# Patient Record
Sex: Male | Born: 1945 | Race: White | Hispanic: No | Marital: Married | Smoking: Current every day smoker
Health system: Southern US, Community
[De-identification: ages and names within clinical notes are randomized; demographics above are authoritative.]

## PROBLEM LIST (undated history)

## (undated) DIAGNOSIS — F1010 Alcohol abuse, uncomplicated: Secondary | ICD-10-CM

## (undated) DIAGNOSIS — K922 Gastrointestinal hemorrhage, unspecified: Secondary | ICD-10-CM

## (undated) DIAGNOSIS — Z720 Tobacco use: Secondary | ICD-10-CM

## (undated) DIAGNOSIS — F419 Anxiety disorder, unspecified: Secondary | ICD-10-CM

## (undated) DIAGNOSIS — N400 Enlarged prostate without lower urinary tract symptoms: Secondary | ICD-10-CM

## (undated) DIAGNOSIS — I219 Acute myocardial infarction, unspecified: Secondary | ICD-10-CM

## (undated) DIAGNOSIS — J449 Chronic obstructive pulmonary disease, unspecified: Secondary | ICD-10-CM

## (undated) DIAGNOSIS — I2510 Atherosclerotic heart disease of native coronary artery without angina pectoris: Secondary | ICD-10-CM

## (undated) HISTORY — PX: HERNIA REPAIR: SHX51

## (undated) HISTORY — PX: CORONARY ARTERY BYPASS GRAFT: SHX141

## (undated) HISTORY — PX: OTHER SURGICAL HISTORY: SHX169

## (undated) HISTORY — PX: CARDIAC CATHETERIZATION: SHX172

---

## 1998-05-26 ENCOUNTER — Inpatient Hospital Stay (HOSPITAL_COMMUNITY): Admission: AD | Admit: 1998-05-26 | Discharge: 1998-05-26 | Payer: Self-pay | Admitting: Cardiovascular Disease

## 1998-05-30 ENCOUNTER — Inpatient Hospital Stay (HOSPITAL_COMMUNITY): Admission: AD | Admit: 1998-05-30 | Discharge: 1998-06-04 | Payer: Self-pay | Admitting: Cardiovascular Disease

## 2014-04-20 ENCOUNTER — Emergency Department (HOSPITAL_COMMUNITY)
Admission: EM | Admit: 2014-04-20 | Discharge: 2014-04-20 | Disposition: A | Discharge status: Home / Self Care | Payer: Self-pay

## 2015-01-28 ENCOUNTER — Emergency Department (HOSPITAL_COMMUNITY): Payer: BLUE CROSS/BLUE SHIELD

## 2015-01-28 ENCOUNTER — Inpatient Hospital Stay (HOSPITAL_COMMUNITY)
Admission: EM | Admit: 2015-01-28 | Discharge: 2015-02-01 | DRG: 378 | Disposition: A | Discharge status: Home / Self Care | Payer: BLUE CROSS/BLUE SHIELD | Attending: Internal Medicine | Admitting: Internal Medicine

## 2015-01-28 ENCOUNTER — Encounter (HOSPITAL_COMMUNITY): Payer: Self-pay

## 2015-01-28 DIAGNOSIS — Z7951 Long term (current) use of inhaled steroids: Secondary | ICD-10-CM | POA: Diagnosis not present

## 2015-01-28 DIAGNOSIS — N400 Enlarged prostate without lower urinary tract symptoms: Secondary | ICD-10-CM | POA: Diagnosis present

## 2015-01-28 DIAGNOSIS — Z8249 Family history of ischemic heart disease and other diseases of the circulatory system: Secondary | ICD-10-CM

## 2015-01-28 DIAGNOSIS — K922 Gastrointestinal hemorrhage, unspecified: Secondary | ICD-10-CM | POA: Diagnosis not present

## 2015-01-28 DIAGNOSIS — F1020 Alcohol dependence, uncomplicated: Secondary | ICD-10-CM | POA: Diagnosis present

## 2015-01-28 DIAGNOSIS — F10220 Alcohol dependence with intoxication, uncomplicated: Secondary | ICD-10-CM | POA: Diagnosis not present

## 2015-01-28 DIAGNOSIS — R079 Chest pain, unspecified: Secondary | ICD-10-CM | POA: Diagnosis not present

## 2015-01-28 DIAGNOSIS — K649 Unspecified hemorrhoids: Secondary | ICD-10-CM | POA: Diagnosis not present

## 2015-01-28 DIAGNOSIS — F17210 Nicotine dependence, cigarettes, uncomplicated: Secondary | ICD-10-CM | POA: Diagnosis present

## 2015-01-28 DIAGNOSIS — K2921 Alcoholic gastritis with bleeding: Secondary | ICD-10-CM | POA: Diagnosis present

## 2015-01-28 DIAGNOSIS — R0789 Other chest pain: Secondary | ICD-10-CM | POA: Diagnosis present

## 2015-01-28 DIAGNOSIS — Z7982 Long term (current) use of aspirin: Secondary | ICD-10-CM | POA: Diagnosis not present

## 2015-01-28 DIAGNOSIS — I252 Old myocardial infarction: Secondary | ICD-10-CM | POA: Diagnosis not present

## 2015-01-28 DIAGNOSIS — Z800 Family history of malignant neoplasm of digestive organs: Secondary | ICD-10-CM

## 2015-01-28 DIAGNOSIS — F17200 Nicotine dependence, unspecified, uncomplicated: Secondary | ICD-10-CM | POA: Diagnosis present

## 2015-01-28 DIAGNOSIS — Z951 Presence of aortocoronary bypass graft: Secondary | ICD-10-CM

## 2015-01-28 DIAGNOSIS — K449 Diaphragmatic hernia without obstruction or gangrene: Secondary | ICD-10-CM | POA: Diagnosis present

## 2015-01-28 DIAGNOSIS — D649 Anemia, unspecified: Secondary | ICD-10-CM | POA: Diagnosis not present

## 2015-01-28 DIAGNOSIS — I25118 Atherosclerotic heart disease of native coronary artery with other forms of angina pectoris: Secondary | ICD-10-CM | POA: Diagnosis not present

## 2015-01-28 DIAGNOSIS — I25708 Atherosclerosis of coronary artery bypass graft(s), unspecified, with other forms of angina pectoris: Secondary | ICD-10-CM | POA: Diagnosis not present

## 2015-01-28 DIAGNOSIS — J438 Other emphysema: Secondary | ICD-10-CM | POA: Diagnosis not present

## 2015-01-28 DIAGNOSIS — K648 Other hemorrhoids: Secondary | ICD-10-CM | POA: Diagnosis present

## 2015-01-28 DIAGNOSIS — I2510 Atherosclerotic heart disease of native coronary artery without angina pectoris: Secondary | ICD-10-CM | POA: Diagnosis present

## 2015-01-28 DIAGNOSIS — F1010 Alcohol abuse, uncomplicated: Secondary | ICD-10-CM | POA: Insufficient documentation

## 2015-01-28 DIAGNOSIS — F419 Anxiety disorder, unspecified: Secondary | ICD-10-CM | POA: Diagnosis present

## 2015-01-28 DIAGNOSIS — J449 Chronic obstructive pulmonary disease, unspecified: Secondary | ICD-10-CM | POA: Diagnosis present

## 2015-01-28 DIAGNOSIS — R7989 Other specified abnormal findings of blood chemistry: Secondary | ICD-10-CM

## 2015-01-28 DIAGNOSIS — K2210 Ulcer of esophagus without bleeding: Secondary | ICD-10-CM | POA: Diagnosis not present

## 2015-01-28 DIAGNOSIS — D509 Iron deficiency anemia, unspecified: Secondary | ICD-10-CM | POA: Diagnosis present

## 2015-01-28 DIAGNOSIS — K2990 Gastroduodenitis, unspecified, without bleeding: Secondary | ICD-10-CM | POA: Diagnosis not present

## 2015-01-28 DIAGNOSIS — Z791 Long term (current) use of non-steroidal anti-inflammatories (NSAID): Secondary | ICD-10-CM | POA: Diagnosis not present

## 2015-01-28 DIAGNOSIS — K2980 Duodenitis without bleeding: Secondary | ICD-10-CM | POA: Diagnosis present

## 2015-01-28 DIAGNOSIS — R778 Other specified abnormalities of plasma proteins: Secondary | ICD-10-CM

## 2015-01-28 DIAGNOSIS — I248 Other forms of acute ischemic heart disease: Secondary | ICD-10-CM | POA: Diagnosis present

## 2015-01-28 DIAGNOSIS — I209 Angina pectoris, unspecified: Secondary | ICD-10-CM | POA: Diagnosis not present

## 2015-01-28 DIAGNOSIS — K2970 Gastritis, unspecified, without bleeding: Secondary | ICD-10-CM | POA: Diagnosis not present

## 2015-01-28 HISTORY — DX: Acute myocardial infarction, unspecified: I21.9

## 2015-01-28 HISTORY — DX: Benign prostatic hyperplasia without lower urinary tract symptoms: N40.0

## 2015-01-28 HISTORY — DX: Chronic obstructive pulmonary disease, unspecified: J44.9

## 2015-01-28 HISTORY — DX: Anxiety disorder, unspecified: F41.9

## 2015-01-28 HISTORY — DX: Alcohol abuse, uncomplicated: F10.10

## 2015-01-28 HISTORY — DX: Tobacco use: Z72.0

## 2015-01-28 HISTORY — DX: Atherosclerotic heart disease of native coronary artery without angina pectoris: I25.10

## 2015-01-28 LAB — BASIC METABOLIC PANEL
Anion gap: 9 (ref 5–15)
BUN: 6 mg/dL (ref 6–23)
CALCIUM: 8.4 mg/dL (ref 8.4–10.5)
CHLORIDE: 103 mmol/L (ref 96–112)
CO2: 26 mmol/L (ref 19–32)
Creatinine, Ser: 0.65 mg/dL (ref 0.50–1.35)
GFR calc non Af Amer: >90 mL/min (ref >90)
GLUCOSE: 82 mg/dL (ref 70–99)
Potassium: 4.4 mmol/L (ref 3.5–5.1)
SODIUM: 138 mmol/L (ref 135–145)

## 2015-01-28 LAB — PROTIME-INR
INR: 1.08 (ref 0.00–1.49)
Prothrombin Time: 14.1 seconds (ref 11.6–15.2)

## 2015-01-28 LAB — CBC WITH DIFFERENTIAL/PLATELET
BASOS ABS: 0 10*3/uL (ref 0.0–0.1)
Basophils Relative: 1 % (ref 0–1)
Eosinophils Absolute: 0.2 10*3/uL (ref 0.0–0.7)
Eosinophils Relative: 3 % (ref 0–5)
HCT: 19.5 % — ABNORMAL LOW (ref 39.0–52.0)
Hemoglobin: 5.9 g/dL — CL (ref 13.0–17.0)
Lymphocytes Relative: 23 % (ref 12–46)
Lymphs Abs: 1 10*3/uL (ref 0.7–4.0)
MCH: 24.2 pg — ABNORMAL LOW (ref 26.0–34.0)
MCHC: 30.3 g/dL (ref 30.0–36.0)
MCV: 79.9 fL (ref 78.0–100.0)
MONO ABS: 0.5 10*3/uL (ref 0.1–1.0)
MONOS PCT: 11 % (ref 3–12)
NEUTROS ABS: 2.8 10*3/uL (ref 1.7–7.7)
NEUTROS PCT: 62 % (ref 43–77)
PLATELETS: 245 10*3/uL (ref 150–400)
RBC: 2.44 MIL/uL — ABNORMAL LOW (ref 4.22–5.81)
RDW: 18.1 % — ABNORMAL HIGH (ref 11.5–15.5)
WBC: 4.5 10*3/uL (ref 4.0–10.5)

## 2015-01-28 LAB — SALICYLATE LEVEL: Salicylate Lvl: <4 mg/dL (ref 2.8–20.0)

## 2015-01-28 LAB — TROPONIN I
TROPONIN I: 0.1 ng/mL — AB (ref <0.031)
Troponin I: 0.09 ng/mL — ABNORMAL HIGH (ref <0.031)

## 2015-01-28 LAB — PREPARE RBC (CROSSMATCH)

## 2015-01-28 LAB — ETHANOL: Alcohol, Ethyl (B): 47 mg/dL — ABNORMAL HIGH (ref 0–9)

## 2015-01-28 LAB — ABO/RH: ABO/RH(D): O POS

## 2015-01-28 LAB — ACETAMINOPHEN LEVEL

## 2015-01-28 MED ORDER — ONDANSETRON HCL 4 MG/2ML IJ SOLN: 4.0000 mg | Freq: Four times a day (QID) | INTRAMUSCULAR | Status: DC | PRN

## 2015-01-28 MED ORDER — BUDESONIDE-FORMOTEROL FUMARATE 160-4.5 MCG/ACT IN AERO
2.0000 | INHALATION_SPRAY | Freq: Two times a day (BID) | RESPIRATORY_TRACT | Status: DC
  Administered 2015-01-28 – 2015-02-01 (×8): 2 via RESPIRATORY_TRACT
  Filled 2015-01-28: qty 6

## 2015-01-28 MED ORDER — TAMSULOSIN HCL 0.4 MG PO CAPS
0.4000 mg | ORAL_CAPSULE | Freq: Every day | ORAL | Status: DC
  Administered 2015-01-29 – 2015-02-01 (×4): 0.4 mg via ORAL
  Filled 2015-01-28 (×4): qty 1

## 2015-01-28 MED ORDER — SODIUM CHLORIDE 0.9 % IJ SOLN
3.0000 mL | Freq: Two times a day (BID) | INTRAMUSCULAR | Status: DC
  Administered 2015-01-29 – 2015-02-01 (×6): 3 mL via INTRAVENOUS

## 2015-01-28 MED ORDER — LORAZEPAM 2 MG/ML IJ SOLN: 0.0000 mg | Freq: Two times a day (BID) | INTRAMUSCULAR | Status: DC

## 2015-01-28 MED ORDER — ONDANSETRON HCL 4 MG PO TABS
4.0000 mg | ORAL_TABLET | Freq: Four times a day (QID) | ORAL | Status: DC | PRN
  Administered 2015-01-29: 4 mg via ORAL
  Filled 2015-01-28: qty 1

## 2015-01-28 MED ORDER — LORAZEPAM 2 MG/ML IJ SOLN: 1.0000 mg | Freq: Four times a day (QID) | INTRAMUSCULAR | Status: AC | PRN

## 2015-01-28 MED ORDER — LORAZEPAM 2 MG/ML IJ SOLN: 0.0000 mg | Freq: Four times a day (QID) | INTRAMUSCULAR | Status: AC

## 2015-01-28 MED ORDER — FOLIC ACID 1 MG PO TABS
1.0000 mg | ORAL_TABLET | Freq: Every day | ORAL | Status: DC
  Administered 2015-01-28 – 2015-02-01 (×5): 1 mg via ORAL
  Filled 2015-01-28 (×5): qty 1

## 2015-01-28 MED ORDER — ADULT MULTIVITAMIN W/MINERALS CH
1.0000 | ORAL_TABLET | Freq: Every day | ORAL | Status: DC
  Administered 2015-01-28 – 2015-02-01 (×5): 1 via ORAL
  Filled 2015-01-28 (×5): qty 1

## 2015-01-28 MED ORDER — SODIUM CHLORIDE 0.9 % IV SOLN
10.0000 mL/h | Freq: Once | INTRAVENOUS | Status: AC
  Administered 2015-01-28: 10 mL/h via INTRAVENOUS

## 2015-01-28 MED ORDER — DEXTROSE-NACL 5-0.9 % IV SOLN
INTRAVENOUS | Status: DC
  Administered 2015-01-28: 23:00:00 via INTRAVENOUS

## 2015-01-28 MED ORDER — SODIUM CHLORIDE 0.9 % IV SOLN
8.0000 mg/h | INTRAVENOUS | Status: AC
  Administered 2015-01-28 – 2015-01-29 (×2): 8 mg/h via INTRAVENOUS
  Filled 2015-01-28 (×5): qty 80

## 2015-01-28 MED ORDER — TIOTROPIUM BROMIDE MONOHYDRATE 18 MCG IN CAPS
18.0000 ug | ORAL_CAPSULE | Freq: Every day | RESPIRATORY_TRACT | Status: DC
  Administered 2015-01-29 – 2015-02-01 (×4): 18 ug via RESPIRATORY_TRACT
  Filled 2015-01-28: qty 5

## 2015-01-28 MED ORDER — THIAMINE HCL 100 MG/ML IJ SOLN: 100.0000 mg | Freq: Every day | INTRAMUSCULAR | Status: DC

## 2015-01-28 MED ORDER — VITAMIN B-1 100 MG PO TABS
100.0000 mg | ORAL_TABLET | Freq: Every day | ORAL | Status: DC
Start: 2015-01-28 — End: 2015-02-01
  Administered 2015-01-28 – 2015-02-01 (×5): 100 mg via ORAL
  Filled 2015-01-28 (×5): qty 1

## 2015-01-28 MED ORDER — SODIUM CHLORIDE 0.9 % IV SOLN: INTRAVENOUS | Status: DC

## 2015-01-28 MED ORDER — PANTOPRAZOLE SODIUM 40 MG IV SOLR
INTRAVENOUS | Status: AC
  Filled 2015-01-28: qty 160

## 2015-01-28 MED ORDER — PANTOPRAZOLE SODIUM 40 MG IV SOLR
80.0000 mg | Freq: Once | INTRAVENOUS | Status: AC
  Administered 2015-01-28: 80 mg via INTRAVENOUS
  Filled 2015-01-28: qty 80

## 2015-01-28 MED ORDER — LORAZEPAM 0.5 MG PO TABS
1.0000 mg | ORAL_TABLET | Freq: Four times a day (QID) | ORAL | Status: AC | PRN
  Administered 2015-01-29 – 2015-01-31 (×2): 1 mg via ORAL
  Filled 2015-01-28 (×3): qty 2

## 2015-01-28 MED ORDER — BUDESONIDE-FORMOTEROL FUMARATE 160-4.5 MCG/ACT IN AERO
INHALATION_SPRAY | RESPIRATORY_TRACT | Status: AC
  Filled 2015-01-28: qty 6

## 2015-01-28 NOTE — Progress Notes (Signed)
eLink Physician-Brief Progress Note Patient Name: Ethan Butler DOB: 01/16/1946 MRN: [id number]   Date of Service  01/28/2015  HPI/Events of Note  69 yo male with PMHx of CAD, EtOH abuse, now with low Hb, no active bleeding per patient, noted to have (+) guiac in ER  eICU Interventions  Cont with H\H monitoring Cont with 2u PRBC IV Protonix gtt Volume CIWA      Intervention Category Evaluation Type: New Patient Evaluation  Ethan Butler 01/28/2015, 10:27 PM

## 2015-01-28 NOTE — ED Notes (Signed)
CRITICAL VALUE ALERT  Critical value received:  Hemoglobin 5.9  Date of notification:  01/28/15  Time of notification:  1800  Critical value read back:Yes.    Nurse who received alert:  Dorris Fetchaphyne Anderson, RN  MD notified (1st page):  Dr Clarene DukeMcManus  Time of first page:  [phone]

## 2015-01-28 NOTE — Progress Notes (Signed)
Spoke with Dr. Conley RollsLe to clarify confusion from ED nurse and blood transfusion order. He modified blood transfusion order to mean to tranfuse 2 units of PRBC total then. He received one in ED and is receiving one right now. Will recheck H/H after transfusion is complete and keep other unit on hand if he has not reached hgb of 10.

## 2015-01-28 NOTE — H&P (Signed)
[facility] History and Physical  Ethan Butler ZOX:[id number] DOB: 1945/11/02    PCP:   None.   Chief Complaint: chest discomfort with exertion.  HPI: Ethan Butler is an 69 y.o. male with hx of significant and ongoing alcohol abuse, hx of known CAD, s/p CABGx5 many years ago, hx of COPD and ongoing tobacco abuse, BPH, anxiety, presents to the ER complaining of increasing chest discomfort with exertion, relieved at rest.  He denied fever, chills, or coughs.  Work up in the ER included his EKG in NSR with no acute ischemic changes, and negative troponins, but his Hb was found to be 5.9 grams per dL, MCV of 79, normal WBC and platelets. Though his OB stool in the ER was positive, he denied black or bloody stool.  He had no lightheadedness, and has been rather asymptomatic at rest.  He had been taking more ASA due to his chest discomfort with exertion.  He wanted to go home, as his mother had just passed, and he has funeral to attend.  I was agreeable to stay finally, however.  I noted his BUN and his hemodynamics were in acceptable ranges.   Rewiew of Systems:  Constitutional: Negative for malaise, fever and chills. No significant weight loss or weight gain Eyes: Negative for eye pain, redness and discharge, diplopia, visual changes, or flashes of light. ENMT: Negative for ear pain, hoarseness, nasal congestion, sinus pressure and sore throat. No headaches; tinnitus, drooling, or problem swallowing. Cardiovascular: Negative for palpitations, diaphoresis, dyspnea and peripheral edema. ; No orthopnea, PND Respiratory: Negative for cough, hemoptysis, wheezing and stridor. No pleuritic chestpain. Gastrointestinal: Negative for nausea, vomiting, diarrhea, constipation, abdominal pain, melena, blood in stool, hematemesis, jaundice and rectal bleeding.    Genitourinary: Negative for frequency, dysuria, incontinence,flank pain and hematuria; Musculoskeletal: Negative for back pain and neck  pain. Negative for swelling and trauma.;  Skin: . Negative for pruritus, rash, abrasions, bruising and skin lesion.; ulcerations Neuro: Negative for headache, lightheadedness and neck stiffness. Negative for weakness, altered level of consciousness , altered mental status, extremity weakness, burning feet, involuntary movement, seizure and syncope.  Psych: negative for anxiety, depression, insomnia, tearfulness, panic attacks, hallucinations, paranoia, suicidal or homicidal ideation    Past Medical History  Diagnosis Date  . Myocardial infarction   . Enlarged prostate   . Alcohol abuse   . COPD (chronic obstructive pulmonary disease)   . Tobacco use   . Coronary artery disease   . Anxiety   . BPH (benign prostatic hypertrophy)     Past Surgical History  Procedure Laterality Date  . Open heart surgery    . Hernia repair      Medications:  HOME MEDS: Prior to Admission medications   Medication Sig Start Date End Date Taking? Authorizing Provider  aspirin 81 MG EC tablet Take 81 mg by mouth daily.   Yes Historical Provider, MD  nitroGLYCERIN (NITROSTAT) 0.4 MG SL tablet Place 0.4 mg under the tongue.   Yes Historical Provider, MD  SPIRIVA HANDIHALER 18 MCG inhalation capsule Place 1 spray into inhaler and inhale daily. 01/17/15  Yes Historical Provider, MD  SYMBICORT 160-4.5 MCG/ACT inhaler Inhale 2 puffs into the lungs 2 (two) times daily. 01/08/15  Yes Historical Provider, MD  tamsulosin (FLOMAX) 0.4 MG CAPS capsule Take 0.4 mg by mouth daily. 01/23/15  Yes Historical Provider, MD     Allergies:  No Known Allergies  Social History:   reports that he has been smoking.  He does not have  any smokeless tobacco history on file. He reports that he drinks alcohol. He reports that he does not use illicit drugs.  Family History: History reviewed. No pertinent family history.   Physical Exam: Filed Vitals:   01/28/15 1729 01/28/15 1730 01/28/15 1800 01/28/15 1830  BP: 138/67  136/68 145/65 137/59  Pulse:  109  96  Temp:      TempSrc:      Resp:  Height:      Weight:      SpO2:  95%  93%   Blood pressure 137/59, pulse 96, temperature 98.6 F (37 C), temperature source Oral, resp. rate 18, height  (1.676 m), weight 63.504 kg (140 lb), SpO2 93 %.  GEN:  Pleasant  patient lying in the stretcher in no acute distress; cooperative with exam. PSYCH:  alert and oriented x4; does not appear anxious or depressed; affect is appropriate. HEENT: Mucous membranes pink and anicteric; PERRLA; EOM intact; no cervical lymphadenopathy nor thyromegaly or carotid bruit; no JVD; There were no stridor. Neck is very supple. Breasts:: Not examined CHEST WALL: No tenderness CHEST: Normal respiration, clear to auscultation bilaterally.  HEART: Regular rate and rhythm.  There are no murmur, rub, or gallops.   BACK: No kyphosis or scoliosis; no CVA tenderness ABDOMEN: soft and non-tender; no masses, no organomegaly, normal abdominal bowel sounds; no pannus; no intertriginous candida. There is no rebound and no distention.  No ascites.  Rectal Exam: Not done EXTREMITIES: No bone or joint deformity; age-appropriate arthropathy of the hands and knees; no edema; no ulcerations.  There is no calf tenderness. Genitalia: not examined PULSES: 2+ and symmetric SKIN: Normal hydration no rash or ulceration CNS: Cranial nerves 2-12 grossly intact no focal lateralizing neurologic deficit.  Speech is fluent; uvula elevated with phonation, facial symmetry and tongue midline. DTR are normal bilaterally, cerebella exam is intact, barbinski is negative and strengths are equaled bilaterally.  No sensory loss.   Labs on Admission:  Basic Metabolic Panel:  Recent Labs Lab 01/28/15 1736  NA 138  K 4.4  CL 103  CO2 26  GLUCOSE 82  BUN 6  CREATININE 0.65  CALCIUM 8.4    Recent Labs Lab 01/28/15 1736  WBC 4.5  NEUTROABS 2.8  HGB 5.9*  HCT 19.5*  MCV 79.9  PLT 245   Cardiac  Enzymes:  Recent Labs Lab 01/28/15 1736  TROPONINI 0.09*    CBG: No results for input(s): GLUCAP in the last 168 hours.   Radiological Exams on Admission: Dg Chest Portable 1 View  01/28/2015   CLINICAL DATA:  Acute onset chest pain.  Coronary artery disease.  EXAM: PORTABLE CHEST - 1 VIEW  COMPARISON:  01/29/2009  FINDINGS: The heart size and mediastinal contours are within normal limits allowing for portable technique. Both lungs are clear. No evidence of pneumothorax or pleural effusion. Prior CABG again noted. Small hiatal hernia also noted.  IMPRESSION: No active disease.  Small hiatal hernia.   Electronically Signed   By: Myles Rosenthal M.D.   On: 01/28/2015 18:29    EKG: Independently reviewed. No ischemic changes.    Assessment/Plan Present on Admission:  . Angina pectoris . Alcohol abuse . Upper GI bleed . Tobacco abuse . Anxiety . CAD (coronary artery disease)  PLAN:  This patient presents with symptoms consistent with classical angina, known CAD, in the setting of severe anemia.  I suspect his anemia is not acute, as he has no hemodynamic instability and is  tolerating his anemia well.  He likely has a slow upper GI bleed.  There is no stigmata of chronic liver disease, and therefore, I don't think he has esosphageal varices.  Will be prudent and admit him to the ICU.  WIll transfuse him to a Hb close to 10 grams. WIll obtain anemia panel.  He will be made NPO, and please consult GI in the am.  Will d/c his ASA, and start PPI drip.  He is also at risk for alcohol withdrawal, as he drinks about 12 beers per day.  Will give scheduled Ativan and place on CIWA protocol.  I think he should have a follow up stress test after he is more stable, and had received blood.  Risk and benefit of transfusion discussed, and he gave me consent.  He is stable, full code, and [facility]  Thank you for letting me participate in his care.   Other plans as per  orders.  Code Status: FULL Unk LightningODE.    Udell Mazzocco, MD. [facility] Pager [phone] 7pm to 7am.  01/28/2015, 7:50 PM

## 2015-01-28 NOTE — ED Provider Notes (Signed)
CSN: [id number]     Arrival date & time 01/28/15  1718 History   First MD Initiated Contact with Patient 01/28/15 1728     Chief Complaint  Patient presents with  . Chest Pain      HPI  Pt was seen at 1730. Per EMS and pt report, c/o gradual onset and worsening of multiple intermittent episodes chest pain for the past 3 weeks. Pt describes the CP as "pressure" and "tightness," which radiates into his left arm, sometimes into both of his arms. Has been associated with SOB and diaphoresis. Symptoms occur when he exerts himself/walks. Symptoms improve over 15 to 25 minutes after resting and "taking some aspirin." Pt states he has taken "7 baby aspirin today" for his symptoms. Denies symptoms at rest. Denies cough, no palpitations, no abd pain, no N/V/D, no back pain.    Past Medical History  Diagnosis Date  . Myocardial infarction   . Enlarged prostate   . Alcohol abuse   . COPD (chronic obstructive pulmonary disease)   . Tobacco use   . Coronary artery disease   . Anxiety   . BPH (benign prostatic hypertrophy)    Past Surgical History  Procedure Laterality Date  . Open heart surgery    . Hernia repair      History  Substance Use Topics  . Smoking status: Current Every Day Smoker  . Smokeless tobacco: Not on file  . Alcohol Use: Yes     Comment: daily- last etoh was today, 2 25oz beers    Review of Systems  ROS: Statement: All systems negative except as marked or noted in the HPI; Constitutional: Negative for fever and chills. ; ; Eyes: Negative for eye pain, redness and discharge. ; ; ENMT: Negative for ear pain, hoarseness, nasal congestion, sinus pressure and sore throat. ; ; Cardiovascular: +CP, SOB, diaphoresis. Negative for palpitations, and peripheral edema. ; ; Respiratory: Negative for cough, wheezing and stridor. ; ; Gastrointestinal: Negative for nausea, vomiting, diarrhea, abdominal pain, blood in stool, hematemesis, jaundice and rectal bleeding. . ; ; Genitourinary:  Negative for dysuria, flank pain and hematuria. ; ; Musculoskeletal: Negative for back pain and neck pain. Negative for swelling and trauma.; ; Skin: Negative for pruritus, rash, abrasions, blisters, bruising and skin lesion.; ; Neuro: Negative for headache, lightheadedness and neck stiffness. Negative for weakness, altered level of consciousness , altered mental status, extremity weakness, paresthesias, involuntary movement, seizure and syncope.       Allergies  Review of patient's allergies indicates no known allergies.  Home Medications   Prior to Admission medications   Medication Sig Start Date End Date Taking? Authorizing Provider  aspirin 81 MG EC tablet Take 81 mg by mouth daily.   Yes Historical Provider, MD  nitroGLYCERIN (NITROSTAT) 0.4 MG SL tablet Place 0.4 mg under the tongue.   Yes Historical Provider, MD  SPIRIVA HANDIHALER 18 MCG inhalation capsule Place 1 spray into inhaler and inhale daily. 01/17/15  Yes Historical Provider, MD  SYMBICORT 160-4.5 MCG/ACT inhaler Inhale 2 puffs into the lungs 2 (two) times daily. 01/08/15  Yes Historical Provider, MD  tamsulosin (FLOMAX) 0.4 MG CAPS capsule Take 0.4 mg by mouth daily. 01/23/15  Yes Historical Provider, MD   BP 137/59 mmHg  Pulse 96  Temp(Src) 98.6 F (37 C) (Oral)  Resp 18  Ht  (1.676 m)  Wt 140 lb (63.504 kg)  BMI 22.61 kg/m2  SpO2 93% Physical Exam  1735: Physical examination:  Nursing notes reviewed;  Vital signs and O2 SAT reviewed;  Constitutional: Well developed, Well nourished, Well hydrated, In no acute distress; Head:  Normocephalic, atraumatic; Eyes: EOMI, PERRL, No scleral icterus; ENMT: Mouth and pharynx normal, Mucous membranes moist; Neck: Supple, Full range of motion, No lymphadenopathy; Cardiovascular: Regular rate and rhythm, No gallop; Respiratory: Breath sounds clear & equal bilaterally, No wheezes.  Speaking full sentences with ease, Normal respiratory effort/excursion; Chest: Nontender, Movement  normal; Abdomen: Soft, Nontender, Nondistended, Normal bowel sounds. Rectal exam performed w/permission of pt and ED RN chaperone present.  Anal tone normal.  Non-tender, minimal amount of soft brown stool in rectal vault, heme positive.  No fissures, no external hemorrhoids, no palp masses.; Genitourinary: No CVA tenderness; Extremities: Pulses normal, No tenderness, No edema, No calf edema or asymmetry.; Neuro: AA&Ox3, Major CN grossly intact.  Speech clear. No gross focal motor or sensory deficits in extremities.; Skin: Color pale, Warm, Dry.   ED Course  Procedures     EKG Interpretation   Date/Time:  Saturday January 28 2015 17:27:06 EDT Ventricular Rate:  98 PR Interval:  142 QRS Duration: 97 QT Interval:  374 QTC Calculation: 477 R Axis:   96 Text Interpretation:  Sinus rhythm Right axis deviation RSR' in V1 or V2,  probably normal variant Abnrm T, consider ischemia, anterolateral lds No  old tracing to compare Confirmed by [facility]  MD, Nicholos JohnsKATHLEEN [phone]([id number]) on  01/28/2015 5:48:50 PM      MDM  MDM Reviewed: previous chart, nursing note and vitals Interpretation: labs, ECG and x-ray Total time providing critical care: 30-74 minutes. This excludes time spent performing separately reportable procedures and services. Consults: admitting MD   CRITICAL CARE Performed by: Laray AngerMCMANUS,Karmina Zufall M Total critical care time: 35 Critical care time was exclusive of separately billable procedures and treating other patients. Critical care was necessary to treat or prevent imminent or life-threatening deterioration. Critical care was time spent personally by me on the following activities: development of treatment plan with patient and/or surrogate as well as nursing, discussions with consultants, evaluation of patient's response to treatment, examination of patient, obtaining history from patient or surrogate, ordering and performing treatments and interventions, ordering and review of laboratory  studies, ordering and review of radiographic studies, pulse oximetry and re-evaluation of patient's condition.   Results for orders placed or performed during the hospital encounter of 01/28/15  CBC with Differential  Result Value Ref Range   WBC 4.5 4.0 - 10.5 K/uL   RBC 2.44 (L) 4.22 - 5.81 MIL/uL   Hemoglobin 5.9 (LL) 13.0 - 17.0 g/dL   HCT 13.019.5 (L) 86.539.0 - 78.452.0 %   MCV 79.9 78.0 - 100.0 fL   MCH 24.2 (L) 26.0 - 34.0 pg   MCHC 30.3 30.0 - 36.0 g/dL   RDW 69.618.1 (H) 29.511.5 - 28.415.5 %   Platelets 245 150 - 400 K/uL   Neutrophils Relative % 62 43 - 77 %   Neutro Abs 2.8 1.7 - 7.7 K/uL   Lymphocytes Relative 23 12 - 46 %   Lymphs Abs 1.0 0.7 - 4.0 K/uL   Monocytes Relative 11 3 - 12 %   Monocytes Absolute 0.5 0.1 - 1.0 K/uL   Eosinophils Relative 3 0 - 5 %   Eosinophils Absolute 0.2 0.0 - 0.7 K/uL   Basophils Relative 1 0 - 1 %   Basophils Absolute 0.0 0.0 - 0.1 K/uL  Basic metabolic panel  Result Value Ref Range   Sodium 138 135 - 145 mmol/L   Potassium  4.4 3.5 - 5.1 mmol/L   Chloride 103 96 - 112 mmol/L   CO2 26 19 - 32 mmol/L   Glucose, Bld 82 70 - 99 mg/dL   BUN 6 6 - 23 mg/dL   Creatinine, Ser 7.82 0.50 - 1.35 mg/dL   Calcium 8.4 8.4 - 95.6 mg/dL   GFR calc non Af Amer >90 >90 mL/min   GFR calc Af Amer >90 >90 mL/min   Anion gap 9 5 - 15  Troponin I  Result Value Ref Range   Troponin I 0.09 (H) <0.031 ng/mL  Ethanol  Result Value Ref Range   Alcohol, Ethyl (B) 47 (H) 0 - 9 mg/dL  Type and screen  Result Value Ref Range   ABO/RH(D) O POS    Antibody Screen PENDING    Sample Expiration 01/31/2015   Prepare RBC  Result Value Ref Range   Order Confirmation ORDER PROCESSED BY BLOOD BANK   ABO/Rh  Result Value Ref Range   ABO/RH(D) O POS    Dg Chest Portable 1 View 01/28/2015   CLINICAL DATA:  Acute onset chest pain.  Coronary artery disease.  EXAM: PORTABLE CHEST - 1 VIEW  COMPARISON:  01/29/2009  FINDINGS: The heart size and mediastinal contours are within normal limits  allowing for portable technique. Both lungs are clear. No evidence of pneumothorax or pleural effusion. Prior CABG again noted. Small hiatal hernia also noted.  IMPRESSION: No active disease.  Small hiatal hernia.   Electronically Signed   By: Myles Rosenthal M.D.   On: 01/28/2015 18:29    1910:  Hgb 5.9, no old to compare; will start PRBC transfusion. Stool heme positive; likely from recent ASA use as well as daily etoh use. Will start IV Protonix bolus and gtt. Pt denies black or bloody stools. Denies CP while sitting on stretcher. Dx and testing d/w pt.  Questions answered.  Verb understanding, agreeable to admit. T/C to [facility]. Conley Rolls, case discussed, including:  HPI, pertinent PM/SHx, VS/PE, dx testing, ED course and treatment:  Agreeable to admit, requests to write temporary orders, obtain ICU bed to team APAdmits.     Samuel Jester, DO 01/30/15 2112

## 2015-01-28 NOTE — ED Notes (Signed)
Pt reports chest pain x 3 weeks.  PT says called ems today because every time he would walk, chest began to hurt and he became SOB.  EMS reports pt has taken a total of 7 baby aspirins today.  EMS started IV and performed ekg.  Pt presently pain free, and denies any SOB.

## 2015-01-29 ENCOUNTER — Encounter (HOSPITAL_COMMUNITY): Payer: Self-pay | Admitting: Gastroenterology

## 2015-01-29 DIAGNOSIS — R079 Chest pain, unspecified: Secondary | ICD-10-CM

## 2015-01-29 DIAGNOSIS — I248 Other forms of acute ischemic heart disease: Secondary | ICD-10-CM

## 2015-01-29 DIAGNOSIS — F1020 Alcohol dependence, uncomplicated: Secondary | ICD-10-CM

## 2015-01-29 DIAGNOSIS — D649 Anemia, unspecified: Secondary | ICD-10-CM

## 2015-01-29 DIAGNOSIS — J449 Chronic obstructive pulmonary disease, unspecified: Secondary | ICD-10-CM

## 2015-01-29 LAB — MRSA PCR SCREENING: MRSA by PCR: NEGATIVE

## 2015-01-29 LAB — HEMOGLOBIN AND HEMATOCRIT, BLOOD
HEMATOCRIT: 27.7 % — AB (ref 39.0–52.0)
HEMOGLOBIN: 8.6 g/dL — AB (ref 13.0–17.0)

## 2015-01-29 LAB — TSH: TSH: 2.553 u[IU]/mL (ref 0.350–4.500)

## 2015-01-29 LAB — RETICULOCYTES
RBC.: 3.2 MIL/uL — ABNORMAL LOW (ref 4.22–5.81)
Retic Count, Absolute: 28.8 10*3/uL (ref 19.0–186.0)
Retic Ct Pct: 0.9 % (ref 0.4–3.1)

## 2015-01-29 LAB — HEMATOCRIT: HEMATOCRIT: 26.2 % — AB (ref 39.0–52.0)

## 2015-01-29 LAB — FERRITIN: Ferritin: 9 ng/mL — ABNORMAL LOW (ref 22–322)

## 2015-01-29 LAB — TROPONIN I
TROPONIN I: 0.11 ng/mL — AB (ref <0.031)
Troponin I: 0.1 ng/mL — ABNORMAL HIGH (ref <0.031)

## 2015-01-29 LAB — IRON AND TIBC
Iron: 174 ug/dL — ABNORMAL HIGH (ref 42–165)
Saturation Ratios: 40 % (ref 20–55)
TIBC: 430 ug/dL (ref 215–435)
UIBC: 256 ug/dL (ref 125–400)

## 2015-01-29 LAB — FOLATE

## 2015-01-29 LAB — VITAMIN B12: VITAMIN B 12: 277 pg/mL (ref 211–911)

## 2015-01-29 LAB — HEMOGLOBIN: HEMOGLOBIN: 8.1 g/dL — AB (ref 13.0–17.0)

## 2015-01-29 LAB — PREPARE RBC (CROSSMATCH)

## 2015-01-29 MED ORDER — SODIUM CHLORIDE 0.9 % IV SOLN
Freq: Once | INTRAVENOUS | Status: AC
Start: 2015-01-29 — End: 2015-01-29
  Administered 2015-01-29: 10:00:00 via INTRAVENOUS

## 2015-01-29 MED ORDER — SODIUM CHLORIDE 0.9 % IV SOLN: Freq: Once | INTRAVENOUS | Status: AC

## 2015-01-29 MED ORDER — PANTOPRAZOLE SODIUM 40 MG PO TBEC
40.0000 mg | DELAYED_RELEASE_TABLET | Freq: Two times a day (BID) | ORAL | Status: DC
  Administered 2015-01-30 – 2015-02-01 (×5): 40 mg via ORAL
  Filled 2015-01-29 (×5): qty 1

## 2015-01-29 NOTE — Progress Notes (Signed)
  Echocardiogram 2D Echocardiogram has been performed.  Ethan Butler, Ethan Butler A 01/29/2015, 12:32 PM

## 2015-01-29 NOTE — Progress Notes (Signed)
PROGRESS NOTE  Ethan Butler NFA:[id number] DOB: Aug 27, 1946 DOA: 01/28/2015 PCP: No primary care provider on file.  Summary: 69 year old man with history of alcohol dependence, drinks 12 beers per day, NSAID use who presented with chest pain for 3 weeks, exertional in nature, associated with shortness of breath and diaphoresis.  He was admitted for profound symptomatic anemia and chest pain.  Assessment/Plan: 1. Severe symptomatic anemia, multifactorial, guaiac positive: heavy NSAID use last several weeks, alcohol. Improved with PRBC. Suspect slow upper GI blood loss. No active bleeding noted. 2. CP with typical features suspicious for angina. Resolved. EKG non-acute. 3. Demand ischemia in the setting of severe anemia. Troponins relatively flat. Chest pain free. 4. Alcohol dependence with elevated serum ethanol on admission. CIWA. No evidence of withdrawal. 5. CAD, s/p CABG x5 6. COPD on Spiriva, Symbicort. Stable. 7. Tobacco dependence. 8. Mother just passed away, funeral today. Discussed current issues, patient agreeable to stay.   Symptoms resolved. Hemodynamics stable. Given demand ischemia with transfuse 3rd unit PRBC. No NSAIDs. GI consult.  F/u anemia panel, continue PPI infusion  Trend troponin, check echocardiogram. History highly suspicious for unstable angina last 4 weeks (not currently though). Will need cardiology evaluation when stable otherwise.  Monitor for alcohol withdrawal  Send UDS  Discussed with brother at bedside  Code Status: full code DVT prophylaxis: SCDs Family Communication:  Disposition Plan: home  Brendia Sacks, MD  [facility]  Pager [phone] If 7PM-7AM, please contact night-coverage at [email or web address], password [facility] 01/29/2015, 8:35 AM  LOS: 1 day   Consultants:  GI  Procedures:  Transfusion 3 units PRBC  Antibiotics:    HPI/Subjective: Feels good, no chest pain, no SOB, no bleeding.  Objective: Filed Vitals:   01/29/15  0500 01/29/15 0600 01/29/15 0700 01/29/15 0800  BP: 105/57 128/51 125/67 146/76  Pulse: 94 65 94 96  Temp:      TempSrc:      Resp: Height:      Weight: 62 kg (136 lb 11 oz)     SpO2: 100% 100% 96% 92%    Intake/Output Summary (Last 24 hours) at 01/29/15 0835 Last data filed at 01/29/15 0800  Gross per 24 hour  Intake 2280.42 ml  Output   1225 ml  Net 1055.42 ml     Filed Weights   01/28/15 1728 01/28/15 2155 01/29/15 0500  Weight: 63.504 kg (140 lb) 61.2 kg (134 lb 14.7 oz) 62 kg (136 lb 11 oz)    Exam:     Afebrile, VSS General:  Appears calm and comfortable ENT: grossly normal hearing  Cardiovascular: RRR, no m/r/g. No LE edema. Telemetry: SR, no arrhythmias  Respiratory: CTA bilaterally, no w/r/r. Normal respiratory effort. Abdomen: soft, ntnd, no epigastric pain Psychiatric: grossly normal mood and affect, speech fluent and appropriate  New data reviewed:  Hemoglobin 8.1.  Pertinent data since admission:  EKG: Sinus rhythm, right axis deviation, T-wave inversion V1, V2, consider ischemia.  Chest x-ray no acute disease.  Troponins 0.09 >> 0.10 >> 0.11  Hemoglobin 5.9 >> 8.1. CBC otherwise unremarkable.  Basic metabolic panel unremarkable.  Serum alcohol was elevated on admission.  Pending data:  Anemia panel  Scheduled Meds: . budesonide-formoterol  2 puff Inhalation BID  . folic acid  1 mg Oral Daily  . LORazepam  0-4 mg Intravenous Q6H   Followed by  . [START ON 01/31/2015] LORazepam  0-4 mg Intravenous Q12H  . multivitamin with minerals  1 tablet Oral Daily  .  sodium chloride  3 mL Intravenous Q12H  . tamsulosin  0.4 mg Oral Daily  . thiamine  100 mg Oral Daily   Or  . thiamine  100 mg Intravenous Daily  . tiotropium  18 mcg Inhalation Daily   Continuous Infusions: . dextrose 5 % and 0.9% NaCl 100 mL/hr at 01/29/15 0800  . pantoprozole (PROTONIX) infusion 8 mg/hr (01/29/15 0800)    Principal Problem:   Symptomatic  anemia Active Problems:   Chest pain   Alcohol dependence   Upper GI bleed   Tobacco dependence   Anxiety   CAD (coronary artery disease)   Demand ischemia   COPD (chronic obstructive pulmonary disease)  Time spent 25 minutes

## 2015-01-29 NOTE — Consult Note (Signed)
Referring Provider: No ref. provider found Primary Care Physician:  No primary care provider on file. Primary Gastroenterologist:  Ethan Butler  Reason for Consultation:  ANEMIA   Impression: ADMITTED WITH CHEST PAIN/SON ON EXERTION WITH POSITIVE TROPONINS AND Hb 5.9. CLINICALLY IMPROVED.  Plan: 1. TRANFUSE pRBCs PRN 2. PLAN ENDOSCOPY AFTER CARDIAC CLEARANCE. ANTICIPATE TCS/?EGD APR 5 WITH PROPOFOL 3. PT WILL NEED MAC DUE TO DAILY ETOH USE. 4. BID PPI. STOP PROTONIX GTT 5. HEART HEALTHY DIET THEN NPO AFTER MN   HPI:  PT HAVING CHEST AND SON WITH EXERTION FOR PAST MO. HAD ENDOSCOPY AT [facility] > 5 YRS AGO. DRINK CASE OF BEER A DAY.WAS TAKING LARGE DOSES OF ASA DAILY WHEN HE CHEST HURT. BOWEL MOVE REGULARLY. NO WEIGHT LOSS. APPETITE: GOOD.    PT DENIES FEVER, CHILLS, HEMATOCHEZIA, HEMATEMESIS, nausea, vomiting, melena, diarrhea, CHEST PAIN, SHORTNESS OF BREATH,  CHANGE IN BOWEL IN HABITS, constipation, abdominal pain, problems swallowing, OR heartburn or indigestion.   Past Medical History  Diagnosis Date  . Myocardial infarction   . Enlarged prostate   . Alcohol abuse   . COPD (chronic obstructive pulmonary disease)   . Tobacco use   . Coronary artery disease   . Anxiety   . BPH (benign prostatic hypertrophy)     Past Surgical History  Procedure Laterality Date  . Open heart surgery    . Hernia repair      Prior to Admission medications   Medication Sig Start Date End Date Taking? Authorizing Provider  aspirin 81 MG EC tablet Take 81 mg by mouth daily.   Yes Historical Provider, MD  nitroGLYCERIN (NITROSTAT) 0.4 MG SL tablet Place 0.4 mg under the tongue.   Yes Historical Provider, MD  SPIRIVA HANDIHALER 18 MCG inhalation capsule Place 1 spray into inhaler and inhale daily. 01/17/15  Yes Historical Provider, MD  SYMBICORT 160-4.5 MCG/ACT inhaler Inhale 2 puffs into the lungs 2 (two) times daily. 01/08/15  Yes Historical Provider, MD  tamsulosin (FLOMAX) 0.4 MG CAPS capsule  Take 0.4 mg by mouth daily. 01/23/15  Yes Historical Provider, MD    Current Facility-Administered Medications  Medication Dose Route Frequency Provider Last Rate Last Dose  . budesonide-formoterol (SYMBICORT) 160-4.5 MCG/ACT inhaler 2 puff  2 puff Inhalation BID Houston SirenPeter Le, MD   2 puff at 01/28/15 2319  . folic acid (FOLVITE) tablet 1 mg  1 mg Oral Daily Houston SirenPeter Le, MD   1 mg at 01/29/15 0729  . LORazepam (ATIVAN) injection 0-4 mg  0-4 mg Intravenous Q6H Houston SirenPeter Le, MD   0 mg at 01/28/15 2000   Followed by  . [START ON 01/31/2015] LORazepam (ATIVAN) injection 0-4 mg  0-4 mg Intravenous Q12H Houston SirenPeter Le, MD      . LORazepam (ATIVAN) tablet 1 mg  1 mg Oral Q6H PRN Houston SirenPeter Le, MD       Or  . LORazepam (ATIVAN) injection 1 mg  1 mg Intravenous Q6H PRN Houston SirenPeter Le, MD      . multivitamin with minerals tablet 1 tablet  1 tablet Oral Daily Houston SirenPeter Le, MD   1 tablet at 01/29/15 0729  . ondansetron (ZOFRAN) tablet 4 mg  4 mg Oral Q6H PRN Houston SirenPeter Le, MD       Or  . ondansetron [facility](ZOFRAN) injection 4 mg  4 mg Intravenous Q6H PRN Houston SirenPeter Le, MD      . pantoprazole (PROTONIX) 80 mg in sodium chloride 0.9 % 250 mL (0.32 mg/mL) infusion  8 mg/hr Intravenous Continuous Nicholos JohnsKathleen  McManus, DO 25 mL/hr at 01/29/15 1000 8 mg/hr at 01/29/15 1000  . sodium chloride 0.9 % injection 3 mL  3 mL Intravenous Q12H Houston Siren, MD   3 mL at 01/29/15 1000  . tamsulosin (FLOMAX) capsule 0.4 mg  0.4 mg Oral Daily Houston Siren, MD   0.4 mg at 01/29/15 1610  . thiamine (VITAMIN B-1) tablet 100 mg  100 mg Oral Daily Houston Siren, MD   100 mg at 01/29/15 9604   Or  . thiamine (B-1) injection 100 mg  100 mg Intravenous Daily Houston Siren, MD      . tiotropium [facility]) inhalation capsule 18 mcg  18 mcg Inhalation Daily Houston Siren, MD        Allergies as of 01/28/2015  . (No Known Allergies)   FAMILY HISTORY: NO COLON CANCER/POLYPS  History   Social History  . Marital Status: Single    Spouse Name: N/A  . Number of Children: N/A  . Years of Education: N/A    Social History Main Topics  . Smoking status: Current Every Day Smoker -- 1.00 packs/day  . Smokeless tobacco: Not on file  . Alcohol Use: Yes     Comment: daily- last etoh was today, 2 25oz beers, 12 pk per day  . Drug Use: No  . Sexual Activity: Not on file   Other Topics Concern  . 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 DRINK A CASE OF BEER A DAY.    Review of Systems: PER HPI OTHERWISE ALL SYSTEMS ARE NEGATIVE.   Vitals: Blood pressure 104/83, pulse 91, temperature 97.9 F (36.6 C), temperature source Oral, resp. rate 15, height  (1.727 m), weight 136 lb 11 oz (62 kg), SpO2 94 %.  Physical Exam: General:   Alert,  Well-developed, well-nourished, pleasant and cooperative in NAD Head:  Normocephalic and atraumatic. Eyes:  Sclera clear, no icterus.   Conjunctiva pink. Mouth:  No lesions, dentition ABnormal. Neck:  Supple; no masses. Lungs:  Clear throughout to auscultation.   No wheezes. No acute distress. Heart:  Regular rate and rhythm; no murmurs. Abdomen:  Soft, nontender and nondistended. No masses noted. Normal bowel sounds, without guarding, and without rebound.   Msk:  Symmetrical.  Extremities:  Without edema. Neurologic:  Alert and  oriented x4;  grossly normal neurologically. Cervical Nodes:  No significant cervical adenopathy. Psych:  Alert and cooperative. Normal mood and affect.  Lab Results:  Recent Labs  01/28/15 1736 01/29/15 0348  WBC 4.5  --   HGB 5.9* 8.1*  HCT 19.5* 26.2*  PLT 245  --    BMET  Recent Labs  01/28/15 1736  NA 138  K 4.4  CL 103  CO2 26  GLUCOSE 82  BUN 6  CREATININE 0.65  CALCIUM 8.4    Studies/Results: CXR: SMALL HIATAL HERNIA   LOS: 1 day   Ethan Butler  01/29/2015, 11:19 AM

## 2015-01-30 DIAGNOSIS — F10220 Alcohol dependence with intoxication, uncomplicated: Secondary | ICD-10-CM

## 2015-01-30 DIAGNOSIS — F17200 Nicotine dependence, unspecified, uncomplicated: Secondary | ICD-10-CM

## 2015-01-30 DIAGNOSIS — F419 Anxiety disorder, unspecified: Secondary | ICD-10-CM

## 2015-01-30 DIAGNOSIS — I25708 Atherosclerosis of coronary artery bypass graft(s), unspecified, with other forms of angina pectoris: Secondary | ICD-10-CM

## 2015-01-30 LAB — BASIC METABOLIC PANEL
Anion gap: 6 (ref 5–15)
BUN: 6 mg/dL (ref 6–23)
CALCIUM: 8.2 mg/dL — AB (ref 8.4–10.5)
CO2: 27 mmol/L (ref 19–32)
Chloride: 105 mmol/L (ref 96–112)
Creatinine, Ser: 0.6 mg/dL (ref 0.50–1.35)
GFR calc Af Amer: >90 mL/min (ref >90)
GFR calc non Af Amer: >90 mL/min (ref >90)
Glucose, Bld: 102 mg/dL — ABNORMAL HIGH (ref 70–99)
Potassium: 3.9 mmol/L (ref 3.5–5.1)
SODIUM: 138 mmol/L (ref 135–145)

## 2015-01-30 LAB — CBC
HEMATOCRIT: 29.9 % — AB (ref 39.0–52.0)
Hemoglobin: 9.3 g/dL — ABNORMAL LOW (ref 13.0–17.0)
MCH: 25.8 pg — ABNORMAL LOW (ref 26.0–34.0)
MCHC: 31.1 g/dL (ref 30.0–36.0)
MCV: 83.1 fL (ref 78.0–100.0)
PLATELETS: 228 10*3/uL (ref 150–400)
RBC: 3.6 MIL/uL — ABNORMAL LOW (ref 4.22–5.81)
RDW: 17 % — ABNORMAL HIGH (ref 11.5–15.5)
WBC: 4.6 10*3/uL (ref 4.0–10.5)

## 2015-01-30 LAB — HEPATIC FUNCTION PANEL
ALBUMIN: 3.3 g/dL — AB (ref 3.5–5.2)
ALT: 14 U/L (ref 0–53)
AST: 22 U/L (ref 0–37)
Alkaline Phosphatase: 48 U/L (ref 39–117)
BILIRUBIN INDIRECT: 0.6 mg/dL (ref 0.3–0.9)
Bilirubin, Direct: 0.1 mg/dL (ref 0.0–0.5)
TOTAL PROTEIN: 6.3 g/dL (ref 6.0–8.3)
Total Bilirubin: 0.7 mg/dL (ref 0.3–1.2)

## 2015-01-30 LAB — LIPID PANEL
Cholesterol: 132 mg/dL (ref 0–200)
HDL: 64 mg/dL (ref >39)
LDL Cholesterol: 59 mg/dL (ref 0–99)
TRIGLYCERIDES: 45 mg/dL (ref <150)
Total CHOL/HDL Ratio: 2.1 RATIO
VLDL: 9 mg/dL (ref 0–40)

## 2015-01-30 MED ORDER — PEG 3350-KCL-NA BICARB-NACL 420 G PO SOLR
4000.0000 mL | Freq: Once | ORAL | Status: AC
  Administered 2015-01-30: 4000 mL via ORAL
  Filled 2015-01-30: qty 4000

## 2015-01-30 NOTE — Care Management Utilization Note (Signed)
UR completed 

## 2015-01-30 NOTE — Care Management Note (Addendum)
    Page 1 of 1   02/01/2015     11:37:38 AM CARE MANAGEMENT NOTE 02/01/2015  Patient:  Marye RoundRICHARDSON,Julious   Account Number:  [id number]  Date Initiated:  01/30/2015  Documentation initiated by:  Kathyrn SheriffHILDRESS,JESSICA  Subjective/Objective Assessment:   Pt is from home and indepdent at baseline. Pt cares for dependent wife. Pt has home O2 and a neb machine that he "shares with his wife". Pt has no HH services or other DME's. [facility]     Action/Plan:   Pt plans to discharge home with self care. No CM needs.   Anticipated DC Date:  02/01/2015   Anticipated DC Plan:  HOME/SELF CARE      DC Planning Services  CM consult      Choice offered to / List presented to:             Status of service:  Completed, signed off Medicare Important Message given?  YES (If response is "NO", the following Medicare IM given date fields will be blank) Date Medicare IM given:  01/31/2015 Medicare IM given by:  Kathyrn SheriffHILDRESS,JESSICA Date Additional Medicare IM given:  02/01/2015 Additional Medicare IM given by:  Sharrie RothmanAMMY C Ruqaya Strauss  Discharge Disposition:  HOME/SELF CARE  Per UR Regulation:  Reviewed for med. necessity/level of care/duration of stay  If discussed at Long Length of Stay Meetings, dates discussed:    Comments:  02/01/15 1135 Arlyss Queenammy Gunnison Chahal, RN BSN CM Pt discharged home today. No Cm needs noted.  01/31/2015 1245 Kathyrn SheriffJessica Childress, RN, MSN, CM 01/30/2015 1500 Kathyrn SheriffJessica Childress, RN, MSN, CM

## 2015-01-30 NOTE — Progress Notes (Signed)
PROGRESS NOTE  Ethan Butler ZOX:[id number] DOB: July 06, 1946 DOA: 01/28/2015 PCP: No primary care provider on file.  Summary: 69 year old man with history of alcohol dependence, drinks 12 beers per day, NSAID use who presented with chest pain for 3 weeks, exertional in nature, associated with shortness of breath and diaphoresis.  He was admitted for profound symptomatic anemia and chest pain.  Assessment/Plan: 1. Severe symptomatic anemia, multifactorial, guaiac positive: heavy NSAID use last several weeks, alcohol. Improved s/p 3 units PRBC. Suspect slow upper GI blood loss. No active bleeding noted.  2. CP with typical features suspicious for angina. Resolved. EKG non-acute. Troponins minimally elevated, flat, echo reassuring. 3. Demand ischemia in the setting of severe anemia. As above. 4. Alcohol dependence with elevated serum ethanol on admission. CIWA. No evidence of withdrawal. 5. CAD, s/p CABG x5 6. COPD on Spiriva, Symbicort. Remains stable. 7. Tobacco dependence. 8. Mother just passed away, funerall was 2023/02/02.     Remains stable, no CP, Hgb improved, no bleeding; proceed with endoscopy per cardiology  F/u anemia panel, continue PPI   Code Status: full code DVT prophylaxis: SCDs Family Communication:  Disposition Plan: home  Brendia Sacks, MD  [facility]  Pager [phone] If 7PM-7AM, please contact night-coverage at [email or web address], password [facility] 01/30/2015, 7:35 AM  LOS: 2 days   Consultants:  GI  Procedures:  Transfusion 3 units PRBC  Antibiotics:    HPI/Subjective: No complaints. No chest pain or SOB. No bleeding.  Objective: Filed Vitals:   01/30/15 0300 01/30/15 0400 01/30/15 0500 01/30/15 0711  BP: 121/61 116/60 126/61   Pulse:      Temp:  97.9 F (36.6 C)    TempSrc:  Oral    Resp: Height:      Weight:   61.8 kg (136 lb 3.9 oz)   SpO2:    93%    Intake/Output Summary (Last 24 hours) at 01/30/15 0735 Last data filed at  01/30/15 0400  Gross per 24 hour  Intake 1308.34 ml  Output   2625 ml  Net -1316.66 ml     Filed Weights   01/28/15 2155 02-02-2015 0500 01/30/15 0500  Weight: 61.2 kg (134 lb 14.7 oz) 62 kg (136 lb 11 oz) 61.8 kg (136 lb 3.9 oz)    Exam:     Afebrile, vitals stable, no hypoxia General:  Appears comfortable, calm. Cardiovascular: Regular rate and rhythm, no murmur, rub or gallop. No lower extremity edema. Telemetry: Sinus rhythm, no arrhythmias  Respiratory: Clear to auscultation bilaterally, no wheezes, rales or rhonchi. Normal respiratory effort. Psychiatric: grossly normal mood and affect, speech fluent and appropriate  New data reviewed:  UOP 2625  BMP stable  Hemoglobin 9.3  Pertinent data since admission:  EKG: Sinus rhythm, right axis deviation, T-wave inversion V1, V2, consider ischemia.  Chest x-ray no acute disease.  Troponins 0.09 >> 0.10 >> 0.11  Hemoglobin 5.9 >> 8.1 >> 9.3  Basic metabolic panel unremarkable.  Serum alcohol was elevated on admission.  Pending data:  Anemia panel  Scheduled Meds: . budesonide-formoterol  2 puff Inhalation BID  . folic acid  1 mg Oral Daily  . LORazepam  0-4 mg Intravenous Q6H   Followed by  . [START ON 01/31/2015] LORazepam  0-4 mg Intravenous Q12H  . multivitamin with minerals  1 tablet Oral Daily  . pantoprazole  40 mg Oral BID AC  . sodium chloride  3 mL Intravenous Q12H  . tamsulosin  0.4 mg Oral Daily  .  thiamine  100 mg Oral Daily   Or  . thiamine  100 mg Intravenous Daily  . tiotropium  18 mcg Inhalation Daily   Continuous Infusions:    Principal Problem:   Symptomatic anemia Active Problems:   Chest pain   Alcohol dependence   Upper GI bleed   Tobacco dependence   Anxiety   CAD (coronary artery disease)   Demand ischemia   COPD (chronic obstructive pulmonary disease)  Time spent 20 minutes

## 2015-01-30 NOTE — Progress Notes (Signed)
Subjective: States he's feeling fine today. No bowel movement in the past 24 hours. No noted GI bleed. Denies epigastric pain. Denies abdominal pain, N/V. No complaints today.  Objective: Vital signs in last 24 hours: Temp:  [97.8 F (36.6 C)-98.6 F (37 C)] 98.5 F (36.9 C) (04/04 0816) Pulse Rate:  [61-103] 82 (04/03 2100) Resp:  [14-25] 16 (04/04 0800) BP: (104-157)/(40-88) 121/56 mmHg (04/04 0800) SpO2:  [93 %-100 %] 94 % (04/04 0800) Weight:  [136 lb 3.9 oz (61.8 kg)] 136 lb 3.9 oz (61.8 kg) (04/04 0500) Last BM Date: 01/28/15 General:   Alert and oriented, pleasant Head:  Normocephalic and atraumatic. Eyes:  No icterus, sclera clear. Conjuctiva pink.  Heart:  S1, S2 present, no murmurs noted.  Lungs: Clear to auscultation bilaterally, without wheezing, rales, or rhonchi.  Abdomen:  Bowel sounds present, soft, non-tender, non-distended. No HSM noted. No rebound or guarding. No masses appreciated  Pulses:  Normal DP pulses noted. Extremities:  Without clubbing or edema. Neurologic:  Alert and  oriented x4;  grossly normal neurologically. Psych:  Alert and cooperative. Normal mood and affect.  Intake/Output from previous day: 04/03 0701 - 04/04 0700 In: 2320.4 [P.O.:240; I.V.:1745.4; Blood:335] Out: 2625 [Urine:2625] Intake/Output this shift: Total I/O In: -  Out: 750 [Urine:750]  Lab Results:  Recent Labs  01/28/15 1736 01/29/15 0348 01/29/15 1357 01/30/15 0507  WBC 4.5  --   --  4.6  HGB 5.9* 8.1* 8.6* 9.3*  HCT 19.5* 26.2* 27.7* 29.9*  PLT 245  --   --  228   BMET  Recent Labs  01/28/15 1736 01/30/15 0507  NA 138 138  K 4.4 3.9  CL 103 105  CO2 26 27  GLUCOSE 82 102*  BUN 6 6  CREATININE 0.65 0.60  CALCIUM 8.4 8.2*   LFT No results for input(s): PROT, ALBUMIN, AST, ALT, ALKPHOS, BILITOT, BILIDIR, IBILI in the last 72 hours. PT/INR  Recent Labs  01/28/15 1736  LABPROT 14.1  INR 1.08   Hepatitis Panel No results for input(s):  HEPBSAG, HCVAB, HEPAIGM, HEPBIGM in the last 72 hours.   Studies/Results: Dg Chest Portable 1 View  01/28/2015   CLINICAL DATA:  Acute onset chest pain.  Coronary artery disease.  EXAM: PORTABLE CHEST - 1 VIEW  COMPARISON:  01/29/2009  FINDINGS: The heart size and mediastinal contours are within normal limits allowing for portable technique. Both lungs are clear. No evidence of pneumothorax or pleural effusion. Prior CABG again noted. Small hiatal hernia also noted.  IMPRESSION: No active disease.  Small hiatal hernia.   Electronically Signed   By: Myles Rosenthal M.D.   On: 01/28/2015 18:29    Assessment: 69 year old male admitted for chest pain and symptomatic anemia with H/H 5.9/19.5 on admission. Drinks about 12 beers a day. Was taking large doses of ASA for chest pain leading up to admission. Has since been transfused 3 units PRBCs with last unit given 01/29/15 at 10:00 am. Has had appropriate increase in H/H to 8.6/27.7 4/3 at 2:00 pm. This morning his H/H has increased to 9.3/29.9. Cardiology to see today for clearance with likely colonoscopy +/- EGD tomorrow in the OR with propofol.   Plan: 1. Continue to monitor for any GI bleed 2. Plan for TCS +/- EGD in the OR with propofol tomorrow (MAC due to daily ETOH use) 3. Clear liquids today and NPO at midnight for possible procedure tomorrow. 4. Once cardiology clears the patient can order bowel prep  and case request.   Wynne DustEric Candelaria Pies, AGNP-C Adult & Gerontological Nurse Practitioner [facility]    LOS: 2 days    01/30/2015, 8:46 AM

## 2015-01-30 NOTE — Consult Note (Signed)
CARDIOLOGY CONSULT NOTE  Patient ID: Ethan Butler MRN: [id number] DOB/AGE: [birth year] 69 y.o.  Admit date: 01/28/2015 Primary Physician: PCP in [place]  Reason for Consultation: chest pain  HPI: The patient is a 69 yr old male with a PMH significant for alcohol and tobacco abuse, COPD, and CAD. He underwent CABG several years ago by Dr. Cornelius Moras, but does not believe he followed with a cardiologist afterwards. For the past two months, he has been experiencing intermittent exertional chest pain associated with shortness of breath for which he would sit down and rest and it would eventually resolve. He took ASA for this. He used to take SL nitroglycerin several years ago but stopped it due to headaches. He denies palpitations, leg swelling, orthopnea, PND, and syncope. He denies hematemesis, hematochezia, and melena, as well as abdominal pain. He presented to the ED on 4/2. ECG was without acute ischemic abnormalities. Troponin was minimally elevated. Hgb was 5.9. He has since received several PRBC transfusions and Hgb is now up to 9.3. EGD is being planned. He currently denies chest pain. Echocardiogram on 4/3 showed normal LV systolic function, EF 60-65%, with normal regional wall motion, moderate LVH, grade I diastolic dysfunction, and mild mitral regurgitation.  Soc: Has smoked since a teenager, 1-2 ppd. Drinks at least a 12-pack of beer daily. Denies hard liquor and drug use.  Fam: Father died of MI at 19.  No Known Allergies  Current Facility-Administered Medications  Medication Dose Route Frequency Provider Last Rate Last Dose  . budesonide-formoterol (SYMBICORT) 160-4.5 MCG/ACT inhaler 2 puff  2 puff Inhalation BID Houston Siren, MD   2 puff at 01/30/15 [phone]  . folic acid (FOLVITE) tablet 1 mg  1 mg Oral Daily Houston Siren, MD   1 mg at 01/29/15 0729  . LORazepam (ATIVAN) injection 0-4 mg  0-4 mg Intravenous Q6H Houston Siren, MD   0 mg at 01/28/15 2000   Followed by  . [START ON  01/31/2015] LORazepam (ATIVAN) injection 0-4 mg  0-4 mg Intravenous Q12H Houston Siren, MD      . LORazepam (ATIVAN) tablet 1 mg  1 mg Oral Q6H PRN Houston Siren, MD   1 mg at 01/29/15 2104   Or  . LORazepam (ATIVAN) injection 1 mg  1 mg Intravenous Q6H PRN Houston Siren, MD      . multivitamin with minerals tablet 1 tablet  1 tablet Oral Daily Houston Siren, MD   1 tablet at 01/29/15 0729  . ondansetron (ZOFRAN) tablet 4 mg  4 mg Oral Q6H PRN Houston Siren, MD   4 mg at 01/29/15 2104   Or  . ondansetron [facility]) injection 4 mg  4 mg Intravenous Q6H PRN Houston Siren, MD      . pantoprazole (PROTONIX) EC tablet 40 mg  40 mg Oral BID AC [place], MD   40 mg at 01/30/15 0831  . sodium chloride 0.9 % injection 3 mL  3 mL Intravenous Q12H Houston Siren, MD   3 mL at 01/29/15 2104  . tamsulosin (FLOMAX) capsule 0.4 mg  0.4 mg Oral Daily Houston Siren, MD   0.4 mg at 01/29/15 1191  . thiamine (VITAMIN B-1) tablet 100 mg  100 mg Oral Daily Houston Siren, MD   100 mg at 01/29/15 4782   Or  . thiamine (B-1) injection 100 mg  100 mg Intravenous Daily Houston Siren, MD      . tiotropium [facility]) inhalation capsule 18 mcg  18  mcg Inhalation Daily Houston SirenPeter Le, MD   18 mcg at 01/30/15 [id number]    Past Medical History  Diagnosis Date  . Myocardial infarction   . Enlarged prostate   . Alcohol abuse   . COPD (chronic obstructive pulmonary disease)   . Tobacco use   . Coronary artery disease   . Anxiety   . BPH (benign prostatic hypertrophy)     Past Surgical History  Procedure Laterality Date  . Open heart surgery    . Hernia repair      History   Social History  . Marital Status: Single    Spouse Name: N/A  . Number of Children: N/A  . Years of Education: N/A   Occupational History  . Not on file.   Social History Main Topics  . Smoking status: Current Every Day Smoker -- 1.00 packs/day  . Smokeless tobacco: Not on file  . Alcohol Use: Yes     Comment: daily- last etoh was today, 2 25oz beers, 12 pk per day  . Drug Use: No  .  Sexual Activity: Not on file   Other Topics Concern  . 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 DRINK A CASE OF BEER A DAY.      Prior to Admission medications   Medication Sig Start Date End Date Taking? Authorizing Provider  aspirin 81 MG EC tablet Take 81 mg by mouth daily.   Yes Historical Provider, MD  nitroGLYCERIN (NITROSTAT) 0.4 MG SL tablet Place 0.4 mg under the tongue.   Yes Historical Provider, MD  SPIRIVA HANDIHALER 18 MCG inhalation capsule Place 1 spray into inhaler and inhale daily. 01/17/15  Yes Historical Provider, MD  SYMBICORT 160-4.5 MCG/ACT inhaler Inhale 2 puffs into the lungs 2 (two) times daily. 01/08/15  Yes Historical Provider, MD  tamsulosin (FLOMAX) 0.4 MG CAPS capsule Take 0.4 mg by mouth daily. 01/23/15  Yes Historical Provider, MD     Review of systems complete and found to be negative unless listed above in HPI     Physical exam Blood pressure 121/56, pulse 82, temperature 98.5 F (36.9 C), temperature source Oral, resp. rate 16, height 5\' 8"  (1.727 m), weight 136 lb 3.9 oz (61.8 kg), SpO2 94 %. General: NAD Neck: No JVD, no thyromegaly or thyroid nodule.  Lungs: Faint end-expiratory wheezes at bases. CV: Nondisplaced PMI. Regular rate and rhythm, normal S1/S2, no S3/S4, no murmur.  No peripheral edema.  No carotid bruit.  Normal pedal pulses.  Abdomen: Soft, nontender, no hepatosplenomegaly, no distention.  Skin: Intact without lesions or rashes.  Neurologic: Alert and oriented x 3. No asterixis. Psych: Normal affect. Extremities: No clubbing or cyanosis.  HEENT: Normal.   ECG: Most recent ECG reviewed.  Labs:   Lab Results  Component Value Date   WBC 4.6 01/30/2015   HGB 9.3* 01/30/2015   HCT 29.9* 01/30/2015   MCV 83.1 01/30/2015   PLT 228 01/30/2015    Recent Labs Lab 01/30/15 0507  NA 138  K 3.9  CL 105  CO2 27  BUN 6  CREATININE 0.60    CALCIUM 8.2*  GLUCOSE 102*   Lab Results  Component Value Date   TROPONINI 0.10* 01/29/2015   No results found for: CHOL No results found for: HDL No results found for: LDLCALC No results found for: TRIG No results found for: CHOLHDL No results found for: LDLDIRECT  Studies: Dg Chest Portable 1 View  01/28/2015   CLINICAL DATA:  Acute onset chest pain.  Coronary artery disease.  EXAM: PORTABLE CHEST - 1 VIEW  COMPARISON:  01/29/2009  FINDINGS: The heart size and mediastinal contours are within normal limits allowing for portable technique. Both lungs are clear. No evidence of pneumothorax or pleural effusion. Prior CABG again noted. Small hiatal hernia also noted.  IMPRESSION: No active disease.  Small hiatal hernia.   Electronically Signed   By: Myles Rosenthal M.D.   On: 01/28/2015 18:29    ASSESSMENT AND PLAN:  1. Chest pain in context of CAD and CABG: Symptoms are certainly concerning for exertional angina. However, given admission Hgb of 5.9, there is severe supply-demand mismatch and angina would almost certainly be expected given probable severe native epicardial coronary disease. Echocardiogram reassuring with normal LV systolic function and regional wall motion. Troponins minimally elevated (0.09-->0.1), and ECG without acute ischemic abnormalities. He has been hemodynamically stable. Would consider addition of low-dose metoprolol 12.5 mg bid, but will hold off for now until EGD is performed. I recommend nuclear stress testing but this can be performed in the outpatient setting, and is not required prior to undergoing an endoscopic evaluation. Continue to hold ASA until bleeding source is identified. I will check a lipid panel and LFT's.  2. Iron deficiency anemia: Likely has upper GI bleed. Awaiting EGD.  3. Tobacco abuse disorder: Not inclined to quit.  4. Alcohol abuse: No signs of withdrawal at present.   Signed: Prentice Docker, M.D., F.A.C.C.  01/30/2015, 8:42  AM

## 2015-01-31 ENCOUNTER — Encounter (HOSPITAL_COMMUNITY): Payer: Self-pay | Admitting: *Deleted

## 2015-01-31 ENCOUNTER — Inpatient Hospital Stay (HOSPITAL_COMMUNITY): Payer: BLUE CROSS/BLUE SHIELD | Admitting: Anesthesiology

## 2015-01-31 ENCOUNTER — Encounter (HOSPITAL_COMMUNITY)
Admission: EM | Disposition: A | Discharge status: Home / Self Care | Payer: Self-pay | Source: Home / Self Care | Attending: Family Medicine

## 2015-01-31 DIAGNOSIS — K2210 Ulcer of esophagus without bleeding: Secondary | ICD-10-CM

## 2015-01-31 DIAGNOSIS — F1010 Alcohol abuse, uncomplicated: Secondary | ICD-10-CM | POA: Insufficient documentation

## 2015-01-31 DIAGNOSIS — K922 Gastrointestinal hemorrhage, unspecified: Secondary | ICD-10-CM | POA: Insufficient documentation

## 2015-01-31 DIAGNOSIS — K649 Unspecified hemorrhoids: Secondary | ICD-10-CM

## 2015-01-31 DIAGNOSIS — I209 Angina pectoris, unspecified: Secondary | ICD-10-CM | POA: Insufficient documentation

## 2015-01-31 DIAGNOSIS — J438 Other emphysema: Secondary | ICD-10-CM

## 2015-01-31 DIAGNOSIS — K2970 Gastritis, unspecified, without bleeding: Secondary | ICD-10-CM

## 2015-01-31 DIAGNOSIS — K2990 Gastroduodenitis, unspecified, without bleeding: Secondary | ICD-10-CM

## 2015-01-31 DIAGNOSIS — D509 Iron deficiency anemia, unspecified: Secondary | ICD-10-CM

## 2015-01-31 HISTORY — PX: COLONOSCOPY WITH PROPOFOL: SHX5780

## 2015-01-31 HISTORY — PX: ESOPHAGOGASTRODUODENOSCOPY (EGD) WITH PROPOFOL: SHX5813

## 2015-01-31 SURGERY — COLONOSCOPY WITH PROPOFOL
Anesthesia: Monitor Anesthesia Care | Site: Anus

## 2015-01-31 MED ORDER — ONDANSETRON HCL 4 MG/2ML IJ SOLN: 4.0000 mg | Freq: Once | INTRAMUSCULAR | Status: AC | PRN

## 2015-01-31 MED ORDER — GLYCOPYRROLATE 0.2 MG/ML IJ SOLN
0.2000 mg | Freq: Once | INTRAMUSCULAR | Status: AC
  Administered 2015-01-31: 0.2 mg via INTRAVENOUS
  Filled 2015-01-31 (×2): qty 1

## 2015-01-31 MED ORDER — LACTATED RINGERS IV SOLN
INTRAVENOUS | Status: DC
  Administered 2015-01-31: 14:00:00 via INTRAVENOUS

## 2015-01-31 MED ORDER — PROPOFOL INFUSION 10 MG/ML OPTIME
INTRAVENOUS | Status: DC | PRN
  Administered 2015-01-31: 150 ug/kg/min via INTRAVENOUS
  Administered 2015-01-31 (×2): via INTRAVENOUS

## 2015-01-31 MED ORDER — PROPOFOL 10 MG/ML IV BOLUS
INTRAVENOUS | Status: AC
  Filled 2015-01-31: qty 20

## 2015-01-31 MED ORDER — STERILE WATER FOR IRRIGATION IR SOLN
Status: DC | PRN
  Administered 2015-01-31: 1000 mL

## 2015-01-31 MED ORDER — LIDOCAINE VISCOUS 2 % MT SOLN
OROMUCOSAL | Status: AC
  Administered 2015-01-31: 4 mL via OROMUCOSAL
  Filled 2015-01-31: qty 15

## 2015-01-31 MED ORDER — LIDOCAINE HCL (PF) 1 % IJ SOLN
INTRAMUSCULAR | Status: AC
  Filled 2015-01-31: qty 5

## 2015-01-31 MED ORDER — ONDANSETRON HCL 4 MG/2ML IJ SOLN
4.0000 mg | Freq: Once | INTRAMUSCULAR | Status: AC
  Administered 2015-01-31: 4 mg via INTRAVENOUS
  Filled 2015-01-31: qty 2

## 2015-01-31 MED ORDER — FENTANYL CITRATE 0.05 MG/ML IJ SOLN
INTRAMUSCULAR | Status: AC
  Filled 2015-01-31: qty 2

## 2015-01-31 MED ORDER — LIDOCAINE HCL (CARDIAC) 10 MG/ML IV SOLN
INTRAVENOUS | Status: DC | PRN
  Administered 2015-01-31: 50 mg via INTRAVENOUS

## 2015-01-31 MED ORDER — FENTANYL CITRATE 0.05 MG/ML IJ SOLN
25.0000 ug | INTRAMUSCULAR | Status: AC
  Administered 2015-01-31 (×2): 25 ug via INTRAVENOUS
  Filled 2015-01-31: qty 2

## 2015-01-31 MED ORDER — FENTANYL CITRATE 0.05 MG/ML IJ SOLN: 25.0000 ug | INTRAMUSCULAR | Status: DC | PRN

## 2015-01-31 MED ORDER — MIDAZOLAM HCL 2 MG/2ML IJ SOLN
1.0000 mg | INTRAMUSCULAR | Status: DC | PRN
  Administered 2015-01-31 (×2): 2 mg via INTRAVENOUS
  Filled 2015-01-31 (×2): qty 2

## 2015-01-31 MED ORDER — FENTANYL CITRATE 0.05 MG/ML IJ SOLN
INTRAMUSCULAR | Status: DC | PRN
  Administered 2015-01-31 (×2): 50 ug via INTRAVENOUS

## 2015-01-31 MED ORDER — LABETALOL HCL 5 MG/ML IV SOLN
INTRAVENOUS | Status: AC
  Filled 2015-01-31: qty 4

## 2015-01-31 MED ORDER — LIDOCAINE HCL (PF) 1 % IJ SOLN
INTRAMUSCULAR | Status: AC
  Filled 2015-01-31: qty 15

## 2015-01-31 MED ORDER — GLYCOPYRROLATE 0.2 MG/ML IJ SOLN
INTRAMUSCULAR | Status: AC
  Filled 2015-01-31: qty 3

## 2015-01-31 MED ORDER — LIDOCAINE VISCOUS 2 % MT SOLN
4.0000 mL | Freq: Once | OROMUCOSAL | Status: AC
  Administered 2015-01-31: 4 mL via OROMUCOSAL

## 2015-01-31 SURGICAL SUPPLY — 20 items
BLOCK BITE 60FR ADLT L/F BLUE (MISCELLANEOUS) ×4 IMPLANT
ELECT REM PT RETURN 9FT ADLT (ELECTROSURGICAL)
ELECTRODE REM PT RTRN 9FT ADLT (ELECTROSURGICAL) IMPLANT
FCP BXJMBJMB 240X2.8X (CUTTING FORCEPS)
FLOOR PAD 36X40 (MISCELLANEOUS) ×4
FORCEPS BIOP RAD 4 LRG CAP 4 (CUTTING FORCEPS) ×4 IMPLANT
FORCEPS BIOP RJ4 240 W/NDL (CUTTING FORCEPS)
FORCEPS BXJMBJMB 240X2.8X (CUTTING FORCEPS) IMPLANT
FORMALIN 10 PREFIL 20ML (MISCELLANEOUS) ×8 IMPLANT
INJECTOR/SNARE I SNARE (MISCELLANEOUS) IMPLANT
KIT CLEAN ENDO COMPLIANCE (KITS) ×4 IMPLANT
LUBRICANT JELLY 4.5OZ STERILE (MISCELLANEOUS) ×4 IMPLANT
MANIFOLD NEPTUNE II (INSTRUMENTS) ×4 IMPLANT
OVERTUBE ENDOCUFF GREEN (MISCELLANEOUS) ×4 IMPLANT
PAD FLOOR 36X40 (MISCELLANEOUS) ×2 IMPLANT
SNARE SHORT THROW 13M SML OVAL (MISCELLANEOUS) IMPLANT
SYRINGE 60CC LL (MISCELLANEOUS) ×4 IMPLANT
TRAP SPECIMEN MUCOUS 40CC (MISCELLANEOUS) IMPLANT
TUBING IRRIGATION ENDOGATOR (MISCELLANEOUS) ×4 IMPLANT
WATER STERILE IRR 1000ML POUR (IV SOLUTION) ×4 IMPLANT

## 2015-01-31 NOTE — Anesthesia Procedure Notes (Signed)
Procedure Name: MAC Date/Time: 01/31/2015 2:29 PM Performed by: Franco NonesYATES, Felishia Wartman S Pre-anesthesia Checklist: Patient identified, Emergency Drugs available, Suction available, Timeout performed and Patient being monitored Patient Re-evaluated:Patient Re-evaluated prior to inductionOxygen Delivery Method: Non-rebreather mask

## 2015-01-31 NOTE — Progress Notes (Signed)
SUBJECTIVE: Pt denies chest pain, hematochezia, melena, palpitations, and shortness of breath. Awaiting EGD and colonoscopy later today.     Intake/Output Summary (Last 24 hours) at 01/31/15 1026 Last data filed at 01/30/15 2136  Gross per 24 hour  Intake    600 ml  Output   1575 ml  Net   -975 ml    Current Facility-Administered Medications  Medication Dose Route Frequency Provider Last Rate Last Dose  . budesonide-formoterol (SYMBICORT) 160-4.5 MCG/ACT inhaler 2 puff  2 puff Inhalation BID Houston SirenPeter Le, MD   2 puff at 01/31/15 [phone]  . folic acid (FOLVITE) tablet 1 mg  1 mg Oral Daily Houston SirenPeter Le, MD   1 mg at 01/31/15 1013  . LORazepam (ATIVAN) injection 0-4 mg  0-4 mg Intravenous Q12H Houston SirenPeter Le, MD   Stopped at 01/31/15 0000  . LORazepam (ATIVAN) tablet 1 mg  1 mg Oral Q6H PRN Houston SirenPeter Le, MD   1 mg at 01/29/15 2104   Or  . LORazepam (ATIVAN) injection 1 mg  1 mg Intravenous Q6H PRN Houston SirenPeter Le, MD      . multivitamin with minerals tablet 1 tablet  1 tablet Oral Daily Houston SirenPeter Le, MD   1 tablet at 01/31/15 1014  . ondansetron (ZOFRAN) tablet 4 mg  4 mg Oral Q6H PRN Houston SirenPeter Le, MD   4 mg at 01/29/15 2104   Or  . ondansetron [facility](ZOFRAN) injection 4 mg  4 mg Intravenous Q6H PRN Houston SirenPeter Le, MD      . pantoprazole (PROTONIX) EC tablet 40 mg  40 mg Oral BID AC West BaliSandi L Fields, MD   40 mg at 01/31/15 0847  . sodium chloride 0.9 % injection 3 mL  3 mL Intravenous Q12H Houston SirenPeter Le, MD   3 mL at 01/31/15 1015  . tamsulosin (FLOMAX) capsule 0.4 mg  0.4 mg Oral Daily Houston SirenPeter Le, MD   0.4 mg at 01/31/15 1014  . thiamine (VITAMIN B-1) tablet 100 mg  100 mg Oral Daily Houston SirenPeter Le, MD   100 mg at 01/31/15 1014   Or  . thiamine (B-1) injection 100 mg  100 mg Intravenous Daily Houston SirenPeter Le, MD      . tiotropium [facility](SPIRIVA) inhalation capsule 18 mcg  18 mcg Inhalation Daily Houston SirenPeter Le, MD   18 mcg at 01/31/15 0657    Filed Vitals:   01/31/15 0700 01/31/15 0800 01/31/15 0852 01/31/15 0900  BP: 144/80 124/110  130/69  Pulse:       Temp:   98.3 F (36.8 C)   TempSrc:   Oral   Resp: 18 26  17   Height:      Weight:      SpO2:        PHYSICAL EXAM General: NAD HEENT: Normal. Neck: No JVD, no thyromegaly.  Lungs: Clear to auscultation bilaterally with normal respiratory effort. CV: Nondisplaced PMI.  Regular rate and rhythm, normal S1/S2, no S3/S4, no murmur.  No pretibial edema.  No carotid bruit.  Normal pedal pulses.  Abdomen: Soft, nontender, no hepatosplenomegaly, no distention.  Neurologic: Alert and oriented x 3.  Psych: Normal affect. Musculoskeletal: Normal range of motion. No gross deformities. Extremities: No clubbing or cyanosis.   TELEMETRY: Reviewed telemetry pt in sinus rhythm and sinus bradycardia.  LABS: Basic Metabolic Panel:  Recent Labs  11/91/4702/12/13 1736 01/30/15 0507  NA 138 138  K 4.4 3.9  CL 103 105  CO2 26 27  GLUCOSE 82 102*  BUN 6  6  CREATININE 0.65 0.60  CALCIUM 8.4 8.2*   Liver Function Tests:  Recent Labs  01/30/15 0507  AST 22  ALT 14  ALKPHOS 48  BILITOT 0.7  PROT 6.3  ALBUMIN 3.3*   No results for input(s): LIPASE, AMYLASE in the last 72 hours. CBC:  Recent Labs  01/28/15 1736  01/29/15 1357 01/30/15 0507  WBC 4.5  --   --  4.6  NEUTROABS 2.8  --   --   --   HGB 5.9*  < > 8.6* 9.3*  HCT 19.5*  < > 27.7* 29.9*  MCV 79.9  --   --  83.1  PLT 245  --   --  228  < > = values in this interval not displayed. Cardiac Enzymes:  Recent Labs  01/28/15 2219 01/29/15 0350 01/29/15 0936  TROPONINI 0.10* 0.11* 0.10*   BNP: Invalid input(s): POCBNP D-Dimer: No results for input(s): DDIMER in the last 72 hours. Hemoglobin A1C: No results for input(s): HGBA1C in the last 72 hours. Fasting Lipid Panel:  Recent Labs  01/30/15 0507  CHOL 132  HDL 64  LDLCALC 59  TRIG 45  CHOLHDL 2.1   Thyroid Function Tests:  Recent Labs  01/29/15 0348  TSH 2.553   Anemia Panel:  Recent Labs  01/29/15 0348  VITAMINB12 277  FOLATE >20.0  FERRITIN  9*  TIBC 430  IRON 174*  RETICCTPCT 0.9    RADIOLOGY: Dg Chest Portable 1 View  01/28/2015   CLINICAL DATA:  Acute onset chest pain.  Coronary artery disease.  EXAM: PORTABLE CHEST - 1 VIEW  COMPARISON:  01/29/2009  FINDINGS: The heart size and mediastinal contours are within normal limits allowing for portable technique. Both lungs are clear. No evidence of pneumothorax or pleural effusion. Prior CABG again noted. Small hiatal hernia also noted.  IMPRESSION: No active disease.  Small hiatal hernia.   Electronically Signed   By: Myles Rosenthal M.D.   On: 01/28/2015 18:29      ASSESSMENT AND PLAN: 1. Chest pain in context of CAD and CABG: No recurrences since hospitalized. Symptoms are certainly concerning for exertional angina. However, given admission Hgb of 5.9, there is severe supply-demand mismatch and angina would almost certainly be expected given probable severe native epicardial coronary disease. Echocardiogram reassuring with normal LV systolic function and regional wall motion. Troponins minimally elevated (0.09-->0.1), and ECG without acute ischemic abnormalities. He has been hemodynamically stable and during my evaluation, HR 55 bpm range. Would consider addition of low-dose metoprolol 12.5 mg bid perhaps in outpatient setting.  I will also arrange for outpatient nuclear stress testing. Continue to hold ASA until bleeding source is identified. LDL 59.  2. Iron deficiency anemia: Likely has upper GI bleed. Awaiting EGD. Hgb today is pending.  3. Tobacco abuse disorder: Not inclined to quit.  4. Alcohol abuse: No signs of withdrawal at present.  Dispo: No further recommendations. Will arrange for outpatient nuclear stress testing and follow up with me. Will sign off.   Prentice Docker, M.D., F.A.C.C.

## 2015-01-31 NOTE — Transfer of Care (Signed)
Immediate Anesthesia Transfer of Care Note  Patient: Ethan Butler  Procedure(s) Performed: Procedure(s) with comments: COLONOSCOPY WITH PROPOFOL (N/A) - in cecum @ 1450, out @ 1516;   ESOPHAGOGASTRODUODENOSCOPY (EGD) WITH PROPOFOL (N/A)  Patient Location: PACU  Anesthesia Type:MAC  Level of Consciousness: awake and patient cooperative  Airway & Oxygen Therapy: Patient Spontanous Breathing  Post-op Assessment: Report given to RN and Post -op Vital signs reviewed and stable  Post vital signs: Reviewed and stable  Last Vitals:  Filed Vitals:   01/31/15 1415  BP: 131/84  Pulse:   Temp:   Resp: 23    Complications: No apparent anesthesia complications

## 2015-01-31 NOTE — H&P (Signed)
  Primary Care Physician:  No primary care provider on file. Primary Gastroenterologist:  Dr. Darrick PennaFields  Pre-Procedure History & Physical: HPI:  Ethan Butler is a 69 y.o. male here for  Anemia-IRON DEFICIENT.  Past Medical History  Diagnosis Date  . Myocardial infarction   . Enlarged prostate   . Alcohol abuse   . COPD (chronic obstructive pulmonary disease)   . Tobacco use   . Coronary artery disease   . Anxiety   . BPH (benign prostatic hypertrophy)     Past Surgical History  Procedure Laterality Date  . Open heart surgery    . Coronary artery bypass graft    . Cardiac catheterization    . Hernia repair Right     Prior to Admission medications   Medication Sig Start Date End Date Taking? Authorizing Provider  aspirin 81 MG EC tablet Take 81 mg by mouth daily.   Yes Historical Provider, MD  nitroGLYCERIN (NITROSTAT) 0.4 MG SL tablet Place 0.4 mg under the tongue.   Yes Historical Provider, MD  SPIRIVA HANDIHALER 18 MCG inhalation capsule Place 1 spray into inhaler and inhale daily. 01/17/15  Yes Historical Provider, MD  SYMBICORT 160-4.5 MCG/ACT inhaler Inhale 2 puffs into the lungs 2 (two) times daily. 01/08/15  Yes Historical Provider, MD  tamsulosin (FLOMAX) 0.4 MG CAPS capsule Take 0.4 mg by mouth daily. 01/23/15  Yes Historical Provider, MD    Allergies as of 01/28/2015  . (No Known Allergies)    Family History  Problem Relation Age of Onset  . Colon cancer Brother 6160    History   Social History  . Marital Status: Single    Spouse Name: N/A  . Number of Children: N/A  . Years of Education: N/A   Occupational History  . Not on file.   Social History Main Topics  . Smoking status: Current Every Day Smoker -- 2.00 packs/day for 55 years  . Smokeless tobacco: Not on file  . Alcohol Use: Yes     Comment: daily- last etoh was today, 2 25oz beers, 12 pk per day  . Drug Use: No  . Sexual Activity: Not on file   Other Topics Concern  . 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 CURRENTLY MARRIED. DRINK A CASE OF BEER A DAY.    Review of Systems: See HPI, otherwise negative ROS   Physical Exam: BP 131/74 mmHg  Pulse 77  Temp(Src) 97.8 F (36.6 C) (Oral)  Resp 25  Ht 5\' 8"  (1.727 m)  Wt 141 lb 8.6 oz (64.2 kg)  BMI 21.53 kg/m2  SpO2 100% General:   Alert,  pleasant and cooperative in NAD Head:  Normocephalic and atraumatic. Neck:  Supple; Lungs:  Clear throughout to auscultation.    Heart:  Regular rate and rhythm. Abdomen:  Soft, nontender and nondistended. Normal bowel sounds, without guarding, and without rebound.   Neurologic:  Alert and  oriented x4;  grossly normal neurologically.  Impression/Plan:     Anemia-IRON DEFICIENT  PLAN: 1. TCS/?EGD TODAY

## 2015-01-31 NOTE — Anesthesia Preprocedure Evaluation (Signed)
Anesthesia Evaluation  Patient identified by MRN, date of birth, ID band Patient awake    Reviewed: Allergy & Precautions, NPO status , Patient's Chart, lab work & pertinent test results  Airway Mallampati: I  TM Distance: >3 FB     Dental  (+) Edentulous Upper, Edentulous Lower   Pulmonary COPDCurrent Smoker,  breath sounds clear to auscultation        Cardiovascular + angina with exertion + CAD, + Past MI and + CABG Rhythm:Regular Rate:Normal     Neuro/Psych PSYCHIATRIC DISORDERS Anxiety    GI/Hepatic hiatal hernia, (+)     substance abuse  alcohol use,   Endo/Other    Renal/GU      Musculoskeletal   Abdominal   Peds  Hematology  (+) anemia ,   Anesthesia Other Findings   Reproductive/Obstetrics                             Anesthesia Physical Anesthesia Plan  ASA: III  Anesthesia Plan: MAC   Post-op Pain Management:    Induction: Intravenous  Airway Management Planned: Simple Face Mask  Additional Equipment:   Intra-op Plan:   Post-operative Plan:   Informed Consent: I have reviewed the patients History and Physical, chart, labs and discussed the procedure including the risks, benefits and alternatives for the proposed anesthesia with the patient or authorized representative who has indicated his/her understanding and acceptance.     Plan Discussed with:   Anesthesia Plan Comments:         Anesthesia Quick Evaluation

## 2015-01-31 NOTE — Progress Notes (Signed)
[facility] PROGRESS NOTE  Khayman Kirsch JXB:[id number] DOB: 03-15-1946 DOA: 01/28/2015 PCP: No primary care provider on file.  Assessment/Plan: 69 y/o male with PMH of COPD, CAD s/p CABG, alcohol dependence, drinks 12 beers per day, NSAID use who presented with chest pain for 3 weeks, associated with shortness of breath and diaphoresis.  -admitted for profound symptomatic anemia and chest pain  1. Severe symptomatic anemia, guaiac positive: heavy NSAID use, alcohol.  -Hg has improved 5.9-->9.3 s/p 3 units PRBC. Suspected slow upper GI blood loss. No active bleeding noted.  -awaiting for EGD/colon today; cont PPI; avoid NSAIDs  2. Chest pain. Resolved. EKG non-acute. Troponins minimally elevated, echo: LVEF: 60%, no wall motion abnormalities; appreciate cardiology evaluation.  -h/o CAD s/p CABG; Plan BB if tolerated, ASA pend EGD/colon; stress test ? Outpatient  3. Alcohol dependence with elevated serum ethanol on admission. CIWA. No evidence of withdrawal. 4. COPD on Spiriva, Symbicort. Remains stable.  Code Status: full Family Communication: d/w patient, no family at the bedside  (indicate person spoken with, relationship, and if by phone, the number) Disposition Plan: home pend endoscopy   Consultants:  GI  Procedures:  Transfusion 3 units PRBC  Antibiotics:  none (indicate start date, and stop date if known)  HPI/Subjective: Alert, oriented   Objective: Filed Vitals:   01/31/15 0600  BP: 147/66  Pulse:   Temp:   Resp: 16    Intake/Output Summary (Last 24 hours) at 01/31/15 0822 Last data filed at 01/30/15 2136  Gross per 24 hour  Intake    720 ml  Output   1575 ml  Net   -855 ml   Filed Weights   01/29/15 0500 01/30/15 0500 01/31/15 0500  Weight: 62 kg (136 lb 11 oz) 61.8 kg (136 lb 3.9 oz) 64.2 kg (141 lb 8.6 oz)    Exam:   General:  No distress   Cardiovascular: s1,s2; rrr  Respiratory: no wheezing   Abdomen: soft, nt,nd    Musculoskeletal: no lege edema   Data Reviewed: Basic Metabolic Panel:  Recent Labs Lab 01/28/15 1736 01/30/15 0507  NA 138 138  K 4.4 3.9  CL 103 105  CO2 26 27  GLUCOSE 82 102*  BUN 6 6  CREATININE 0.65 0.60  CALCIUM 8.4 8.2*   Liver Function Tests:  Recent Labs Lab 01/30/15 0507  AST 22  ALT 14  ALKPHOS 48  BILITOT 0.7  PROT 6.3  ALBUMIN 3.3*   No results for input(s): LIPASE, AMYLASE in the last 168 hours. No results for input(s): AMMONIA in the last 168 hours. CBC:  Recent Labs Lab 01/28/15 1736 01/29/15 0348 01/29/15 1357 01/30/15 0507  WBC 4.5  --   --  4.6  NEUTROABS 2.8  --   --   --   HGB 5.9* 8.1* 8.6* 9.3*  HCT 19.5* 26.2* 27.7* 29.9*  MCV 79.9  --   --  83.1  PLT 245  --   --  228   Cardiac Enzymes:  Recent Labs Lab 01/28/15 1736 01/28/15 2219 01/29/15 0350 01/29/15 0936  TROPONINI 0.09* 0.10* 0.11* 0.10*   BNP (last 3 results) No results for input(s): BNP in the last 8760 hours.  ProBNP (last 3 results) No results for input(s): PROBNP in the last 8760 hours.  CBG: No results for input(s): GLUCAP in the last 168 hours.  Recent Results (from the past 240 hour(s))  MRSA PCR Screening     Status: None   Collection Time: 01/29/15 12:15 AM  Result Value Ref Range Status   MRSA by PCR NEGATIVE NEGATIVE Final    Comment:        The GeneXpert MRSA Assay (FDA approved for NASAL specimens only), is one component of a comprehensive MRSA colonization surveillance program. It is not intended to diagnose MRSA infection nor to guide or monitor treatment for MRSA infections.      Studies: No results found.  Scheduled Meds: . budesonide-formoterol  2 puff Inhalation BID  . folic acid  1 mg Oral Daily  . LORazepam  0-4 mg Intravenous Q12H  . multivitamin with minerals  1 tablet Oral Daily  . pantoprazole  40 mg Oral BID AC  . sodium chloride  3 mL Intravenous Q12H  . tamsulosin  0.4 mg Oral Daily  . thiamine  100 mg Oral  Daily   Or  . thiamine  100 mg Intravenous Daily  . tiotropium  18 mcg Inhalation Daily   Continuous Infusions:   Principal Problem:   Symptomatic anemia Active Problems:   Chest pain   Alcohol dependence   Upper GI bleed   Tobacco dependence   Anxiety   CAD (coronary artery disease)   Demand ischemia   COPD (chronic obstructive pulmonary disease)    Time spent: >35 minutes     Esperanza SheetsBURIEV, Sandrika Schwinn N  [facility] Pager [phone]. If 7PM-7AM, please contact night-coverage at [email or web address], password [email or web address] 01/31/2015, 8:22 AM  LOS: 3 days

## 2015-01-31 NOTE — Anesthesia Postprocedure Evaluation (Signed)
  Anesthesia Post-op Note  Patient: Ethan Butler  Procedure(s) Performed: Procedure(s) with comments: COLONOSCOPY WITH PROPOFOL (N/A) - in cecum @ 1450, out @ 1516;   ESOPHAGOGASTRODUODENOSCOPY (EGD) WITH PROPOFOL (N/A)  Patient Location: PACU  Anesthesia Type:MAC  Level of Consciousness: awake and patient cooperative  Airway and Oxygen Therapy: Patient Spontanous Breathing  Post-op Pain: none  Post-op Assessment: Post-op Vital signs reviewed, Patient's Cardiovascular Status Stable, Respiratory Function Stable, Patent Airway, No signs of Nausea or vomiting and Pain level controlled  Post-op Vital Signs: Reviewed and stable  Last Vitals:  Filed Vitals:   01/31/15 1545  BP:   Pulse:   Temp: 36.7 C  Resp:     Complications: No apparent anesthesia complications

## 2015-01-31 NOTE — Op Note (Signed)
[facility] [address] [place], [id number]   ENDOSCOPY PROCEDURE REPORT  PATIENT: Ethan Butler, Ethan  MR#: [id number] BIRTHDATE: 1946/07/20 , 68  yrs. old GENDER: male  ENDOSCOPIST: West BaliSandi L Niralya Ohanian, MD REFERRED BY:  PROCEDURE DATE: 01/31/2015 PROCEDURE:   EGD w/ biopsy  INDICATIONS:iron deficiency anemia. MEDICATIONS: Monitored anesthesia care TOPICAL ANESTHETIC:   Viscous Xylocaine ASA CLASS:  DESCRIPTION OF PROCEDURE:     Physical exam was performed.  Informed consent was obtained from the patient after explaining the benefits, risks, and alternatives to the procedure.  The patient was connected to the monitor and placed in the left lateral position.  Continuous oxygen was provided by nasal cannula and IV medicine administered through an indwelling cannula.  After administration of sedation, the patients esophagus was intubated and the     endoscope was advanced under direct visualization to the second portion of the duodenum.  The scope was removed slowly by carefully examining the color, texture, anatomy, and integrity of the mucosa on the way out.  The patient was recovered in endoscopy and discharged home in satisfactory condition.   ESOPHAGUS: The mucosa of the esophagus appeared normal.   STOMACH: LARGE HIATAL HERNIA WITH PARAESOPHAGEAL HERNIAS WITH FEW LINEAR ULCERS.  DISTAL STOMACH WITH LINEAR RYHEMA W/O EROSIONS.  COLD FORCEPS BIOPSIES OBTAINED.  DUODENUM: The duodenal mucosa showed no abnormalities in the bulb and 2nd part of the duodenum.  Cold forceps biopsies were taken in the bulb and second portion. COMPLICATIONS: There were no immediate complications.  ENDOSCOPIC IMPRESSION: 1.   SUSPECT FEDA DUE TO ETOH GASTRITIS/DUODENITIS AND CAMERON'S ULCERS 2.   LARGE HIATAL HERNIA WITH PARAESOPHAGEAL HERNIAS WITH FEW LINEAR ULCERS. 3.   MODERATE GASTRITIS/DUODENITIS  RECOMMENDATIONS: BID PPI AVOID ETOH ADVANCE DIET AWAIT BIOPY.  CONSIDER  GIVENS CAPSULE STUDY IN NEAR FUTURE. OK TO D/C HOME NEXT TCS IN 5 YEARS OR SOONER IF PT DEVELOPS TRANSFUSION DEPENDENT ANEMIA. OPV IN 3 MOS W/ DR.  Kritika Stukes.  REPEAT EXAM:    eSigned:  West BaliSandi L Anneke Cundy, MD 01/31/2015 4:57 PM    CPT CODES: ICD CODES:  The ICD and CPT codes recommended by this software are interpretations from the data that the clinical staff has captured with the software.  The verification of the translation of this report to the ICD and CPT codes and modifiers is the sole responsibility of the health care institution and practicing physician where this report was generated.  [employer] will not be held responsible for the validity of the ICD and CPT codes included on this report.  AMA assumes no liability for data contained or not contained herein. CPT is a Publishing rights managerregistered trademark of the [employer].   PATIENT NAME:  Ethan Butler, Ethan Butler MR#: [id number]

## 2015-01-31 NOTE — Op Note (Signed)
[facility] [address] [place], [id number]   COLONOSCOPY PROCEDURE REPORT  PATIENT: Ethan RoundRichardson, Ethan Butler  MR#: [id number] BIRTHDATE: 08/04/1946 , 68  yrs. old GENDER: male ENDOSCOPIST: West BaliSandi L Ranyah Groeneveld, MD REFERRED BY: PROCEDURE DATE:  01/31/2015 PROCEDURE:   Colonoscopy, diagnostic INDICATIONS:iron deficiency anemia. MEDICATIONS: Monitored anesthesia care  DESCRIPTION OF PROCEDURE:    Physical exam was performed.  Informed consent was obtained from the patient after explaining the benefits, risks, and alternatives to procedure.  The patient was connected to monitor and placed in left lateral position. Continuous oxygen was provided by nasal cannula and IV medicine administered through an indwelling cannula.  After administration of sedation and rectal exam, the patients rectum was intubated and the     colonoscope was advanced under direct visualization to the cecum.  The scope was removed slowly by carefully examining the color, texture, anatomy, and integrity mucosa on the way out.  The patient was recovered in endoscopy and discharged home in satisfactory condition.    COLON FINDINGS: The colon was redundant.  , The colonic mucosa appeared normal.  , and Moderate sized internal hemorrhoids were found. YNABLE TO INTUBATE ILEUM.  PREP QUALITY: good. WITH IRRIGATION CECAL W/D TIME: 16       minutes COMPLICATIONS: None  ENDOSCOPIC IMPRESSION: 1.   The LEFT colon IS SLIGHTLY redundant 2.   NO OBVIOUS SOURCE FOR FEDA IDENTIFIED. 3.   Moderate sized internal hemorrhoids  RECOMMENDATIONS: PROCEED TO EGD. NEXTTCS IN 5 YEARS      _______________________________ eSignedWest [place]ali:  Taneeka Curtner L Ilina Xu, MD 01/31/2015 4:49 PM    CPT CODES: ICD CODES:  The ICD and CPT codes recommended by this software are interpretations from the data that the clinical staff has captured with the software.  The verification of the translation of this report to the ICD and CPT codes  and modifiers is the sole responsibility of the health care institution and practicing physician where this report was generated.  [employer] will not be held responsible for the validity of the ICD and CPT codes included on this report.  AMA assumes no liability for data contained or not contained herein. CPT is a Publishing rights managerregistered trademark of the [employer].

## 2015-02-01 DIAGNOSIS — K2921 Alcoholic gastritis with bleeding: Secondary | ICD-10-CM

## 2015-02-01 LAB — TYPE AND SCREEN
ABO/RH(D): O POS
ANTIBODY SCREEN: NEGATIVE
UNIT DIVISION: 0
UNIT DIVISION: 0
UNIT DIVISION: 0
Unit division: 0
Unit division: 0

## 2015-02-01 LAB — CBC
HCT: 30.4 % — ABNORMAL LOW (ref 39.0–52.0)
HEMOGLOBIN: 9.3 g/dL — AB (ref 13.0–17.0)
MCH: 25.8 pg — ABNORMAL LOW (ref 26.0–34.0)
MCHC: 30.6 g/dL (ref 30.0–36.0)
MCV: 84.4 fL (ref 78.0–100.0)
Platelets: 251 10*3/uL (ref 150–400)
RBC: 3.6 MIL/uL — AB (ref 4.22–5.81)
RDW: 17.7 % — ABNORMAL HIGH (ref 11.5–15.5)
WBC: 4.9 10*3/uL (ref 4.0–10.5)

## 2015-02-01 MED ORDER — ADULT MULTIVITAMIN W/MINERALS CH: 1.0000 | ORAL_TABLET | Freq: Every day | ORAL | Status: DC

## 2015-02-01 MED ORDER — PANTOPRAZOLE SODIUM 40 MG PO TBEC: 40.0000 mg | DELAYED_RELEASE_TABLET | Freq: Two times a day (BID) | ORAL | Status: AC

## 2015-02-01 MED ORDER — NITROGLYCERIN 0.4 MG SL SUBL: 0.4000 mg | SUBLINGUAL_TABLET | SUBLINGUAL | Status: AC | PRN

## 2015-02-01 MED ORDER — FOLIC ACID 1 MG PO TABS: 1.0000 mg | ORAL_TABLET | Freq: Every day | ORAL | Status: DC

## 2015-02-01 MED ORDER — THIAMINE HCL 100 MG PO TABS: 100.0000 mg | ORAL_TABLET | Freq: Every day | ORAL | Status: AC

## 2015-02-01 NOTE — Progress Notes (Signed)
Pt discharged home; IVs removed, no complications, discharge instructions with teach-back given to pt, medications and follow-up appointments explained, pt verbalized understanding. Patient sent with prescriptions and all belongings.

## 2015-02-01 NOTE — Discharge Summary (Signed)
Physician Discharge Summary  Ethan Butler ZOX:[id number] DOB: [birth year] DOA: 01/28/2015  PCP: No primary care provider on file.  Admit date: 01/28/2015 Discharge date: 02/01/2015  Time spent: 45 minutes  Recommendations for Outpatient Follow-up:  -Will be discharged home today. -Advised to follow up with PCP in 2 weeks. -Cardiology will scheduled OP visit for stress test.   Discharge Diagnoses:  Principal Problem:   Alcoholic gastritis with hemorrhage Active Problems:   Symptomatic anemia   Chest pain   Alcohol dependence   Upper GI bleed   Tobacco dependence   Anxiety   CAD (coronary artery disease)   Demand ischemia   COPD (chronic obstructive pulmonary disease)   Alcohol abuse   Angina pectoris   Bleeding gastrointestinal   Discharge Condition: Stable and improved  Filed Weights   01/30/15 0500 01/31/15 0500 02/01/15 0500  Weight: 61.8 kg (136 lb 3.9 oz) 64.2 kg (141 lb 8.6 oz) 64.5 kg (142 lb 3.2 oz)    History of present illness:  Ethan Butler is an 69 y.o. male with hx of significant and ongoing alcohol abuse, hx of known CAD, s/p CABGx5 many years ago, hx of COPD and ongoing tobacco abuse, BPH, anxiety, presents to the ER complaining of increasing chest discomfort with exertion, relieved at rest. He denied fever, chills, or coughs. Work up in the ER included his EKG in NSR with no acute ischemic changes, and negative troponins, but his Hb was found to be 5.9 grams per dL, MCV of 79, normal WBC and platelets. Though his OB stool in the ER was positive, he denied black or bloody stool. He had no lightheadedness, and has been rather asymptomatic at rest. He had been taking more ASA due to his chest discomfort with exertion. He wanted to go home, as his mother had just passed, and he has funeral to attend. I was agreeable to stay finally, however. I noted his BUN and his hemodynamics were in acceptable ranges.   Hospital Course:   Severe, Symptomatic  Anemia -Has been transfused 3 units of PRBCs. -No active blood loss noted. -Hb has been stable around 9.2-9.3 since transfusion. 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 -Protonix BID for now. -Has been instructed to avoid NSAID use.  Chest Pain -Likely related to severe anemia in face of probable CAD. -Has been seen by cardiology with plans for OP stress test.  ETOH Abuse -Thiamine/folate. -No signs of withdrawal. -Not interested in meeting with SW for ETOH cessation resources.  COPD -Continue symbicort/spiriva. -Without acute exacerbation.   Procedures:  EGD   Consultations:  Cardiology, Dr. Purvis Sheffield  GI, Dr. Darrick Penna  Discharge Instructions  Discharge Instructions    Increase activity slowly    Complete by:  As directed             Medication List    STOP taking these medications        aspirin 81 MG EC tablet      TAKE these medications        folic acid 1 MG tablet  Commonly known as:  FOLVITE  Take 1 tablet (1 mg total) by mouth daily.     multivitamin with minerals Tabs tablet  Take 1 tablet by mouth daily.     nitroGLYCERIN 0.4 MG SL tablet  Commonly known as:  NITROSTAT  Place 0.4 mg under the tongue.     pantoprazole 40 MG tablet  Commonly known as:  PROTONIX  Take 1  tablet (40 mg total) by mouth 2 (two) times daily before a meal.     SPIRIVA HANDIHALER 18 MCG inhalation capsule  Generic drug:  tiotropium  Place 1 spray into inhaler and inhale daily.     SYMBICORT 160-4.5 MCG/ACT inhaler  Generic drug:  budesonide-formoterol  Inhale 2 puffs into the lungs 2 (two) times daily.     tamsulosin 0.4 MG Caps capsule  Commonly known as:  FLOMAX  Take 0.4 mg by mouth daily.     thiamine 100 MG tablet  Take 1 tablet (100 mg total) by mouth daily.       No Known Allergies     Follow-up Information    [facility].   Contact information:   [address] [place] [phone]      Follow up with Laqueta LindenKONESWARAN, SURESH A, MD On 03/01/2015.   Specialty:  Cardiology   Why:  2:20 pm    Contact information:   [address] [place] [address] [phone]       Follow up with [facility]. Go on 02/02/2015.   Why:  REPORT TO RADIOLOGY AT [facility] AT 0930 ON 02/02/15 FOR STRESS TEST.. DO NOT EAT AFTER  MIDNIGHT  02/01/15.   Contact information:   [address] [place] [phone]       Schedule an appointment as soon as possible for a visit in 2 weeks to follow up.   Why:  with your primary care physician       The results of significant diagnostics from this hospitalization (including imaging, microbiology, ancillary and laboratory) are listed below for reference.    Significant Diagnostic Studies: Dg Chest Portable 1 View  01/28/2015   CLINICAL DATA:  Acute onset chest pain.  Coronary artery disease.  EXAM: PORTABLE CHEST - 1 VIEW  COMPARISON:  01/29/2009  FINDINGS: The heart size and mediastinal contours are within normal limits allowing for portable technique. Both lungs are clear. No evidence of pneumothorax or pleural effusion. Prior CABG again noted. Small hiatal hernia also noted.  IMPRESSION: No active disease.  Small hiatal hernia.   Electronically Signed   By: Myles RosenthalJohn  Stahl M.D.   On: 01/28/2015 18:29    Microbiology: Recent Results (from the past 240 hour(s))  MRSA PCR Screening     Status: None   Collection Time: 01/29/15 12:15 AM  Result Value Ref Range Status   MRSA by PCR NEGATIVE NEGATIVE Final    Comment:        The GeneXpert MRSA Assay (FDA approved for NASAL specimens only), is one component of a comprehensive MRSA colonization surveillance program. It is not intended to diagnose MRSA infection nor to guide or monitor treatment for MRSA infections.      Labs: Basic Metabolic Panel:  Recent Labs Lab 01/28/15 1736  01/30/15 0507  NA 138 138  K 4.4 3.9  CL 103 105  CO2 26 27  GLUCOSE 82 102*  BUN 6 6  CREATININE 0.65 0.60  CALCIUM 8.4 8.2*   Liver Function Tests:  Recent Labs Lab 01/30/15 0507  AST 22  ALT 14  ALKPHOS 48  BILITOT 0.7  PROT 6.3  ALBUMIN 3.3*   No results for input(s): LIPASE, AMYLASE in the last 168 hours. No results for input(s): AMMONIA in the last 168 hours. CBC:  Recent Labs Lab 01/28/15 1736 01/29/15 0348 01/29/15 1357 01/30/15 0507 02/01/15 0440  WBC  4.5  --   --  4.6 4.9  NEUTROABS 2.8  --   --   --   --   HGB 5.9* 8.1* 8.6* 9.3* 9.3*  HCT 19.5* 26.2* 27.7* 29.9* 30.4*  MCV 79.9  --   --  83.1 84.4  PLT 245  --   --  228 251   Cardiac Enzymes:  Recent Labs Lab 01/28/15 1736 01/28/15 2219 01/29/15 0350 01/29/15 0936  TROPONINI 0.09* 0.10* 0.11* 0.10*   BNP: BNP (last 3 results) No results for input(s): BNP in the last 8760 hours.  ProBNP (last 3 results) No results for input(s): PROBNP in the last 8760 hours.  CBG: No results for input(s): GLUCAP in the last 168 hours.     SignedChaya Jan  [facility] Pager: [phone] 02/01/2015, 10:15 AM

## 2015-02-01 NOTE — Anesthesia Postprocedure Evaluation (Signed)
  Anesthesia Post-op Note  Patient: Ethan Butler  Procedure(s) Performed: Procedure(s) with comments: COLONOSCOPY WITH PROPOFOL (N/A) - in cecum @ 1450, out @ 1516;   ESOPHAGOGASTRODUODENOSCOPY (EGD) WITH PROPOFOL (N/A)  Patient Location: ICU 5  Anesthesia Type:MAC  Level of Consciousness: awake, alert , oriented and patient cooperative  Airway and Oxygen Therapy: Patient Spontanous Breathing  Post-op Pain: none  Post-op Assessment: Post-op Vital signs reviewed, Patient's Cardiovascular Status Stable, Respiratory Function Stable, Patent Airway, No signs of Nausea or vomiting and Pain level controlled  Post-op Vital Signs: Reviewed and stable  Last Vitals:  Filed Vitals:   02/01/15 0800  BP:   Pulse:   Temp: 36.4 C  Resp:     Complications: No apparent anesthesia complications

## 2015-02-01 NOTE — Addendum Note (Signed)
Addendum  created 02/01/15 1052 by Despina Hiddenobert J Eliyanna Ault, CRNA   Modules edited: Notes Section   Notes Section:  File: [id number]

## 2015-02-01 NOTE — Care Management Utilization Note (Signed)
UR completed 

## 2015-02-02 ENCOUNTER — Encounter (HOSPITAL_COMMUNITY): Payer: Self-pay | Admitting: Gastroenterology

## 2015-02-02 ENCOUNTER — Encounter (HOSPITAL_COMMUNITY): Payer: BLUE CROSS/BLUE SHIELD

## 2015-02-02 ENCOUNTER — Encounter (HOSPITAL_COMMUNITY): Admit: 2015-02-02 | Payer: BLUE CROSS/BLUE SHIELD

## 2015-02-02 ENCOUNTER — Ambulatory Visit (HOSPITAL_COMMUNITY): Admit: 2015-02-02 | Payer: Medicare Other

## 2015-02-02 ENCOUNTER — Ambulatory Visit (HOSPITAL_COMMUNITY): Payer: Medicare Other

## 2015-02-02 ENCOUNTER — Encounter (HOSPITAL_COMMUNITY): Payer: Medicare Other

## 2015-02-02 ENCOUNTER — Other Ambulatory Visit: Payer: Self-pay | Admitting: Cardiovascular Disease

## 2015-02-02 DIAGNOSIS — R079 Chest pain, unspecified: Secondary | ICD-10-CM

## 2015-02-07 ENCOUNTER — Encounter (HOSPITAL_COMMUNITY): Payer: BLUE CROSS/BLUE SHIELD

## 2015-02-07 ENCOUNTER — Ambulatory Visit (HOSPITAL_COMMUNITY): Admission: RE | Admit: 2015-02-07 | Payer: BLUE CROSS/BLUE SHIELD | Source: Ambulatory Visit

## 2015-02-13 ENCOUNTER — Telehealth: Payer: Self-pay | Admitting: Gastroenterology

## 2015-02-13 NOTE — Telephone Encounter (Signed)
Tried to with no answer

## 2015-02-13 NOTE — Telephone Encounter (Signed)
Please call pt. His stomach Bx shows gastritis.  HER SMALL BOWEL BIOPSIES ARE NORMAL.  CONTINUE PROTONIX. TAKE 30 MINUTES PRIOR TO MEALS TWICE DAILY. AVOID ETOH. FOLLOW UP IN 3 MOS E30 GASTRITIS/ANEMIA. REPEAT CBC/FERRITIN 1 WEEK PRIOR TO OPV.

## 2015-02-14 ENCOUNTER — Telehealth: Payer: Self-pay | Admitting: Gastroenterology

## 2015-02-14 ENCOUNTER — Encounter: Payer: Self-pay | Admitting: Gastroenterology

## 2015-02-14 ENCOUNTER — Other Ambulatory Visit: Payer: Self-pay

## 2015-02-14 DIAGNOSIS — D649 Anemia, unspecified: Secondary | ICD-10-CM

## 2015-02-14 NOTE — Telephone Encounter (Signed)
Lab orders on file for 05/09/2015.

## 2015-02-14 NOTE — Telephone Encounter (Signed)
REMINDER IN EPIC °

## 2015-02-14 NOTE — Telephone Encounter (Signed)
Lab orders on file for 07/10/2015.

## 2015-02-14 NOTE — Telephone Encounter (Signed)
APPOINTMENT MADE AND LETTER SENT °

## 2015-02-14 NOTE — Telephone Encounter (Signed)
REPEAT CBC/FERRITIN 1 WEEK PRIOR TO OPV.

## 2015-02-14 NOTE — Telephone Encounter (Signed)
Correction: Lab orders on file for 05/09/2015.

## 2015-02-14 NOTE — Telephone Encounter (Signed)
Tried to call, no answer. Mailing a letter to call.

## 2015-02-22 ENCOUNTER — Encounter: Payer: Self-pay | Admitting: Gastroenterology

## 2015-02-22 ENCOUNTER — Ambulatory Visit (INDEPENDENT_AMBULATORY_CARE_PROVIDER_SITE_OTHER): Payer: BLUE CROSS/BLUE SHIELD | Admitting: Gastroenterology

## 2015-02-22 VITALS — BP 164/83 | HR 91 | Temp 96.9°F | Ht 69.0 in | Wt 141.2 lb

## 2015-02-22 DIAGNOSIS — I209 Angina pectoris, unspecified: Secondary | ICD-10-CM

## 2015-02-22 DIAGNOSIS — D509 Iron deficiency anemia, unspecified: Secondary | ICD-10-CM

## 2015-02-22 NOTE — Patient Instructions (Signed)
I am getting the blood work from your doctor. If you are still having anemia or drops in your blood counts, we will need to do a capsule study that takes pictures of your small intestine.   Keep taking Protonix twice a day, 30 minutes before breakfast and dinner.

## 2015-02-22 NOTE — Assessment & Plan Note (Signed)
69 year old male with ETOH gastritis, duodenitis, cameron ulcers as likely source for IDA. No overt GI bleeding. Needs updated labs; if persistent IDA will proceed with capsule study to wrap up evaluation. Obtain outside labs from PCP. Continue PPI BID. Further recommendations to follow.

## 2015-02-22 NOTE — Progress Notes (Signed)
cc'ed to pcp °

## 2015-02-22 NOTE — Progress Notes (Addendum)
Referring Provider: Rebecka Apley, RN Primary Care Physician:  Rebecka Apley, RN  Primary GI: Dr. Darrick Penna   Chief Complaint  Patient presents with  . Anemia  . Follow-up    HPI:   Ethan Butler is a 69 y.o. male presenting today in follow-up after recent hospitalization for severe symptomatic anemia. Received 3 units PRBCs. Colonoscopy unrevealing. EGD with Sheria Lang ulcers, gastritis, duodenitis. Continues to drink ETOH. IDA noted with ferritin 9 early April.  No hematochezia. No melena. No constipation or diarrhea. No N/V.   Past Medical History  Diagnosis Date  . Myocardial infarction   . Enlarged prostate   . Alcohol abuse   . COPD (chronic obstructive pulmonary disease)   . Tobacco use   . Coronary artery disease   . Anxiety   . BPH (benign prostatic hypertrophy)     Past Surgical History  Procedure Laterality Date  . Open heart surgery    . Coronary artery bypass graft    . Cardiac catheterization    . Hernia repair Right   . Colonoscopy with propofol N/A 01/31/2015    Dr. Darrick Penna: 1. left colon slightly redudant, moderate sized internal hemorrhoids  . Esophagogastroduodenoscopy (egd) with propofol N/A 01/31/2015    Dr. Darrick Penna: gastritis, duodenitis, Cameron's ulcers, large hiatal hernia with paraesophageal hernias with few linear ulcers, moderate gastritis, duodenitis. Negative celiac, negative H.pylori     Current Outpatient Prescriptions  Medication Sig Dispense Refill  . folic acid (FOLVITE) 1 MG tablet Take 1 tablet (1 mg total) by mouth daily.    . Multiple Vitamin (MULTIVITAMIN WITH MINERALS) TABS tablet Take 1 tablet by mouth daily.    . nitroGLYCERIN (NITROSTAT) 0.4 MG SL tablet Place 1 tablet (0.4 mg total) under the tongue every 5 (five) minutes as needed for chest pain. (Patient taking differently: Place 0.4 mg under the tongue every 5 (five) minutes as needed for chest pain (never used). ) 30 tablet 2  . pantoprazole (PROTONIX) 40 MG  tablet Take 1 tablet (40 mg total) by mouth 2 (two) times daily before a meal. 60 tablet 2  . SPIRIVA HANDIHALER 18 MCG inhalation capsule Place 1 spray into inhaler and inhale daily.  5  . SYMBICORT 160-4.5 MCG/ACT inhaler Inhale 2 puffs into the lungs 2 (two) times daily.  5  . tamsulosin (FLOMAX) 0.4 MG CAPS capsule Take 0.4 mg by mouth daily.  5  . thiamine 100 MG tablet Take 1 tablet (100 mg total) by mouth daily.     No current facility-administered medications for this visit.    Allergies as of 02/22/2015  . (No Known Allergies)    Family History  Problem Relation Age of Onset  . Colon cancer Brother 70    History   Social History  . Marital Status: Single    Spouse Name: N/A  . Number of Children: N/A  . Years of Education: N/A   Social History Main Topics  . Smoking status: Current Every Day Smoker -- 2.00 packs/day for 55 years  . Smokeless tobacco: Not on file  . Alcohol Use: Yes     Comment: daily- last etoh was today, 2 25oz beers, 12 pk per day  . Drug Use: No  . Sexual Activity: Not on file   Other Topics Concern  . 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 DRINK A CASE OF BEER A  DAY.    Review of Systems: As mentioned in HPI  Physical Exam: BP 164/83 mmHg  Pulse 91  Temp(Src) 96.9 F (36.1 C)  Ht 5\' 9"  (1.753 m)  Wt 141 lb 3.2 oz (64.048 kg)  BMI 20.84 kg/m2 General:   Alert and oriented. No distress noted. Pleasant and cooperative.  Head:  Normocephalic and atraumatic. Eyes:  Conjuctiva clear without scleral icterus. Heart:  S1, S2 present without murmurs, rubs, or gallops. Regular rate and rhythm. Abdomen:  +BS, soft, non-tender and non-distended. No rebound or guarding. Query ventral hernia Msk:  Symmetrical without gross deformities. Normal posture. Extremities:  Without edema. Neurologic:  Alert and  oriented x4;  grossly normal neurologically. Skin:  Intact  without significant lesions or rashes. Psych:  Alert and cooperative. Normal mood and affect.  Lab Results  Component Value Date   WBC 4.9 02/01/2015   HGB 9.3* 02/01/2015   HCT 30.4* 02/01/2015   MCV 84.4 02/01/2015   PLT 251 02/01/2015   Lab Results  Component Value Date   IRON 174* 01/29/2015   TIBC 430 01/29/2015   FERRITIN 9* 01/29/2015

## 2015-02-28 NOTE — Progress Notes (Signed)
Does the PCP have any recent CBC, iron, ferritin? Thanks!

## 2015-03-01 ENCOUNTER — Encounter: Payer: Medicare Other | Admitting: Cardiovascular Disease

## 2015-03-01 NOTE — Progress Notes (Signed)
I requested recent labs from PCP on 4/27 and again today.

## 2015-03-24 ENCOUNTER — Encounter: Payer: Medicare Other | Admitting: Cardiovascular Disease

## 2015-04-07 NOTE — Progress Notes (Signed)
Outside labs review from Mar 02, 2015. Hgb remains 9.9. Heme negative. No updated ferritin but iron was 63 April 2016. Will have scanned in.  Let's get an updated ferritin, as it has been 2 months since this has been done. Will likely need capsule study, but let's get ferritin first.

## 2015-04-10 ENCOUNTER — Other Ambulatory Visit: Payer: Self-pay

## 2015-04-10 DIAGNOSIS — D509 Iron deficiency anemia, unspecified: Secondary | ICD-10-CM

## 2015-04-10 NOTE — Progress Notes (Signed)
LMOM to call. Lab order faxed to [facility].

## 2015-04-10 NOTE — Progress Notes (Signed)
Pt called back and request I mail his lab orders to him to have done at his PCP's.  Mailed.

## 2015-04-12 ENCOUNTER — Encounter: Payer: Self-pay | Admitting: Gastroenterology

## 2015-04-12 ENCOUNTER — Telehealth: Payer: Self-pay | Admitting: Gastroenterology

## 2015-04-12 NOTE — Telephone Encounter (Signed)
Patient had OV with [facility] back in April and is on the July recall list for a follow up and to have CBC, Ferritin labs done one wee prior to OV.

## 2015-04-13 ENCOUNTER — Other Ambulatory Visit: Payer: Self-pay

## 2015-04-13 DIAGNOSIS — D649 Anemia, unspecified: Secondary | ICD-10-CM

## 2015-04-13 NOTE — Telephone Encounter (Signed)
Pt has already been sent the orders for the ferritin and he wanted to have it done at his pcp. I called him today and asked him to wait until I sent him the orders for the cbc and he could take them and have them done at the same time. He said he hasnt received the other orders yet but when he does he will hold them until he gets the one I send him. Lab order released and mailed to the pt.

## 2015-05-16 ENCOUNTER — Ambulatory Visit: Payer: Medicare Other | Admitting: Gastroenterology

## 2015-05-30 ENCOUNTER — Other Ambulatory Visit: Payer: Self-pay

## 2015-05-30 DIAGNOSIS — D649 Anemia, unspecified: Secondary | ICD-10-CM

## 2015-07-03 ENCOUNTER — Emergency Department (HOSPITAL_COMMUNITY)
Admission: EM | Admit: 2015-07-03 | Discharge: 2015-07-03 | Discharge status: Against Medical Advice | Payer: Medicare Other | Attending: Emergency Medicine | Admitting: Emergency Medicine

## 2015-07-03 ENCOUNTER — Encounter (HOSPITAL_COMMUNITY): Payer: Self-pay

## 2015-07-03 DIAGNOSIS — Z9889 Other specified postprocedural states: Secondary | ICD-10-CM | POA: Insufficient documentation

## 2015-07-03 DIAGNOSIS — Z8659 Personal history of other mental and behavioral disorders: Secondary | ICD-10-CM | POA: Insufficient documentation

## 2015-07-03 DIAGNOSIS — I2510 Atherosclerotic heart disease of native coronary artery without angina pectoris: Secondary | ICD-10-CM | POA: Insufficient documentation

## 2015-07-03 DIAGNOSIS — H1189 Other specified disorders of conjunctiva: Secondary | ICD-10-CM | POA: Insufficient documentation

## 2015-07-03 DIAGNOSIS — R000 Tachycardia, unspecified: Secondary | ICD-10-CM | POA: Insufficient documentation

## 2015-07-03 DIAGNOSIS — M6281 Muscle weakness (generalized): Secondary | ICD-10-CM | POA: Diagnosis not present

## 2015-07-03 DIAGNOSIS — F1010 Alcohol abuse, uncomplicated: Secondary | ICD-10-CM | POA: Insufficient documentation

## 2015-07-03 DIAGNOSIS — N400 Enlarged prostate without lower urinary tract symptoms: Secondary | ICD-10-CM | POA: Insufficient documentation

## 2015-07-03 DIAGNOSIS — Z7951 Long term (current) use of inhaled steroids: Secondary | ICD-10-CM | POA: Insufficient documentation

## 2015-07-03 DIAGNOSIS — I252 Old myocardial infarction: Secondary | ICD-10-CM | POA: Diagnosis not present

## 2015-07-03 DIAGNOSIS — J449 Chronic obstructive pulmonary disease, unspecified: Secondary | ICD-10-CM | POA: Insufficient documentation

## 2015-07-03 DIAGNOSIS — K625 Hemorrhage of anus and rectum: Secondary | ICD-10-CM | POA: Diagnosis present

## 2015-07-03 DIAGNOSIS — Z79899 Other long term (current) drug therapy: Secondary | ICD-10-CM | POA: Insufficient documentation

## 2015-07-03 DIAGNOSIS — M7981 Nontraumatic hematoma of soft tissue: Secondary | ICD-10-CM | POA: Insufficient documentation

## 2015-07-03 DIAGNOSIS — K921 Melena: Secondary | ICD-10-CM | POA: Diagnosis not present

## 2015-07-03 DIAGNOSIS — Z951 Presence of aortocoronary bypass graft: Secondary | ICD-10-CM | POA: Diagnosis not present

## 2015-07-03 DIAGNOSIS — Z720 Tobacco use: Secondary | ICD-10-CM | POA: Insufficient documentation

## 2015-07-03 LAB — COMPREHENSIVE METABOLIC PANEL
ALT: 22 U/L (ref 17–63)
AST: 32 U/L (ref 15–41)
Albumin: 3.8 g/dL (ref 3.5–5.0)
Alkaline Phosphatase: 53 U/L (ref 38–126)
Anion gap: 9 (ref 5–15)
BUN: 6 mg/dL (ref 6–20)
CHLORIDE: 99 mmol/L — AB (ref 101–111)
CO2: 25 mmol/L (ref 22–32)
Calcium: 8.3 mg/dL — ABNORMAL LOW (ref 8.9–10.3)
Creatinine, Ser: 0.5 mg/dL — ABNORMAL LOW (ref 0.61–1.24)
GFR calc Af Amer: >60 mL/min (ref >60)
Glucose, Bld: 87 mg/dL (ref 65–99)
POTASSIUM: 3.8 mmol/L (ref 3.5–5.1)
Sodium: 133 mmol/L — ABNORMAL LOW (ref 135–145)
Total Bilirubin: 0.4 mg/dL (ref 0.3–1.2)
Total Protein: 7.1 g/dL (ref 6.5–8.1)

## 2015-07-03 LAB — CBC WITH DIFFERENTIAL/PLATELET
Basophils Absolute: 0 10*3/uL (ref 0.0–0.1)
Basophils Relative: 1 % (ref 0–1)
EOS PCT: 2 % (ref 0–5)
Eosinophils Absolute: 0.2 10*3/uL (ref 0.0–0.7)
HCT: 37.8 % — ABNORMAL LOW (ref 39.0–52.0)
HEMOGLOBIN: 12.5 g/dL — AB (ref 13.0–17.0)
LYMPHS ABS: 1.7 10*3/uL (ref 0.7–4.0)
LYMPHS PCT: 20 % (ref 12–46)
MCH: 30.8 pg (ref 26.0–34.0)
MCHC: 33.1 g/dL (ref 30.0–36.0)
MCV: 93.1 fL (ref 78.0–100.0)
MONOS PCT: 8 % (ref 3–12)
Monocytes Absolute: 0.7 10*3/uL (ref 0.1–1.0)
Neutro Abs: 5.9 10*3/uL (ref 1.7–7.7)
Neutrophils Relative %: 69 % (ref 43–77)
Platelets: 328 10*3/uL (ref 150–400)
RBC: 4.06 MIL/uL — ABNORMAL LOW (ref 4.22–5.81)
RDW: 22.8 % — ABNORMAL HIGH (ref 11.5–15.5)
WBC: 8.5 10*3/uL (ref 4.0–10.5)

## 2015-07-03 LAB — PROTIME-INR
INR: 1.03 (ref 0.00–1.49)
Prothrombin Time: 13.7 seconds (ref 11.6–15.2)

## 2015-07-03 LAB — TYPE AND SCREEN
ABO/RH(D): O POS
Antibody Screen: NEGATIVE

## 2015-07-03 LAB — ETHANOL: Alcohol, Ethyl (B): 213 mg/dL — ABNORMAL HIGH (ref <5)

## 2015-07-03 MED ORDER — PANTOPRAZOLE SODIUM 40 MG IV SOLR
40.0000 mg | Freq: Once | INTRAVENOUS | Status: AC
  Administered 2015-07-03: 40 mg via INTRAVENOUS
  Filled 2015-07-03: qty 40

## 2015-07-03 MED ORDER — IPRATROPIUM-ALBUTEROL 0.5-2.5 (3) MG/3ML IN SOLN
3.0000 mL | Freq: Once | RESPIRATORY_TRACT | Status: AC
  Administered 2015-07-03: 3 mL via RESPIRATORY_TRACT
  Filled 2015-07-03: qty 3

## 2015-07-03 NOTE — ED Notes (Signed)
[employer] brought pt back. Pt talked with niece who tried to convince him to stay. I advised pt that if he left he could bleed to death. Pt states he is still going to leave. IV taken out and pt directed to front to call for a taxi per pt request. NAD at time of [facility] [address]

## 2015-07-03 NOTE — ED Notes (Signed)
Pt states he has had rectal bleeding for two weeks. States he has had rectal bleeding in the past and had to have a blood transfusion. Pt states he drinks beer ever day. EMS, said pt told the he drinks moonshine

## 2015-07-03 NOTE — ED Provider Notes (Signed)
CSN: [id number]     Arrival date & time 07/03/15  1305 History   First MD Initiated Contact with Patient 07/03/15 1321     Chief Complaint  Patient presents with  . Rectal Bleeding     (Consider location/radiation/quality/duration/timing/severity/associated sxs/prior Treatment) HPI Comments: 69 year old male with extensive past medical history including CAD status post CABG, MI, COPD, alcohol abuse who presents with bloody stools. The patient states that for the past 2 weeks, he has had black stools. He reports associated generalized weakness. He has had this problem previously required blood transfusion and hospitalization. He denies any associated fevers, abdominal pain or vomiting. He denies any chest pain or shortness of breath. He does endorse lightheadedness with standing and feels like he is going to pass out if he does not wait before ambulating. He endorses daily alcohol use and drink just prior to arrival. He denies any history of alcohol withdrawal seizures. He has been taking all of his medications that he was given at last discharge.  Patient is a 69 y.o. male presenting with hematochezia. The history is provided by the patient.  Rectal Bleeding   Past Medical History  Diagnosis Date  . Myocardial infarction   . Enlarged prostate   . Alcohol abuse   . COPD (chronic obstructive pulmonary disease)   . Tobacco use   . Coronary artery disease   . Anxiety   . BPH (benign prostatic hypertrophy)    Past Surgical History  Procedure Laterality Date  . Open heart surgery    . Coronary artery bypass graft    . Cardiac catheterization    . Hernia repair Right   . Colonoscopy with propofol N/A 01/31/2015    Dr. Darrick Penna: 1. left colon slightly redudant, moderate sized internal hemorrhoids  . Esophagogastroduodenoscopy (egd) with propofol N/A 01/31/2015    Dr. Darrick Penna: gastritis, duodenitis, Cameron's ulcers, large hiatal hernia with paraesophageal hernias with few linear ulcers, moderate  gastritis, duodenitis. Negative celiac, negative H.pylori    Family History  Problem Relation Age of Onset  . Colon cancer Brother 35   Social History  Substance Use Topics  . Smoking status: Current Every Day Smoker -- 2.00 packs/day for 55 years  . Smokeless tobacco: None  . Alcohol Use: Yes     Comment: daily- last etoh was today, 2 25oz beers, 12 pk per day    Review of Systems  Gastrointestinal: Positive for hematochezia.   10 Systems reviewed and are negative for acute change except as noted in the HPI.    Allergies  Review of patient's allergies indicates no known allergies.  Home Medications   Prior to Admission medications   Medication Sig Start Date End Date Taking? Authorizing Provider  folic acid (FOLVITE) 1 MG tablet Take 1 tablet (1 mg total) by mouth daily. 02/01/15   Henderson Cloud, MD  Multiple Vitamin (MULTIVITAMIN WITH MINERALS) TABS tablet Take 1 tablet by mouth daily. 02/01/15   Henderson Cloud, MD  nitroGLYCERIN (NITROSTAT) 0.4 MG SL tablet Place 1 tablet (0.4 mg total) under the tongue every 5 (five) minutes as needed for chest pain. Patient taking differently: Place 0.4 mg under the tongue every 5 (five) minutes as needed for chest pain (never used).  02/01/15   Henderson Cloud, MD  pantoprazole (PROTONIX) 40 MG tablet Take 1 tablet (40 mg total) by mouth 2 (two) times daily before a meal. 02/01/15   Estela Isaiah Blakes, MD  SPIRIVA HANDIHALER 18 MCG inhalation  capsule Place 1 spray into inhaler and inhale daily. 01/17/15   Historical Provider, MD  SYMBICORT 160-4.5 MCG/ACT inhaler Inhale 2 puffs into the lungs 2 (two) times daily. 01/08/15   Historical Provider, MD  tamsulosin (FLOMAX) 0.4 MG CAPS capsule Take 0.4 mg by mouth daily. 01/23/15   Historical Provider, MD  thiamine 100 MG tablet Take 1 tablet (100 mg total) by mouth daily. 02/01/15   Henderson Cloud, MD   BP 124/52 mmHg  Pulse 75  Temp(Src) 97.9 F (36.6  C) (Oral)  Resp 28  Ht 5\' 10"  (1.778 m)  Wt 146 lb (66.225 kg)  BMI 20.95 kg/m2  SpO2 93% Physical Exam  Constitutional: He is oriented to person, place, and time. No distress.  Thin, chronically ill-appearing gentleman resting comfortably  HENT:  Head: Normocephalic and atraumatic.  Moist mucous membranes  Eyes: Pupils are equal, round, and reactive to light.  pale conjunctivae  Neck: Neck supple.  Cardiovascular: Regular rhythm and normal heart sounds.   No murmur heard. Tachycardic  Pulmonary/Chest: Effort normal and breath sounds normal.  Abdominal: Soft. Bowel sounds are normal. He exhibits no distension. There is no tenderness.  Genitourinary: Guaiac positive stool.  melenic stool  Musculoskeletal: He exhibits no edema.  Neurological: He is alert and oriented to person, place, and time.  Fluent speech  Skin: Skin is warm and dry.  Scattered ecchymoses on arms  Psychiatric: He has a normal mood and affect. Judgment normal.  Nursing note and vitals reviewed.   ED Course  Procedures (including critical care time) Labs Review Labs Reviewed  COMPREHENSIVE METABOLIC PANEL - Abnormal; Notable for the following:    Sodium 133 (*)    Chloride 99 (*)    Creatinine, Ser 0.50 (*)    Calcium 8.3 (*)    All other components within normal limits  CBC WITH DIFFERENTIAL/PLATELET - Abnormal; Notable for the following:    RBC 4.06 (*)    Hemoglobin 12.5 (*)    HCT 37.8 (*)    RDW 22.8 (*)    All other components within normal limits  ETHANOL - Abnormal; Notable for the following:    Alcohol, Ethyl (B) 213 (*)    All other components within normal limits  PROTIME-INR  TYPE AND SCREEN    Imaging Review No results found. I have personally reviewed and evaluated these lab results as part of my medical decision-making.   EKG Interpretation None     Medications  pantoprazole (PROTONIX) injection 40 mg (40 mg Intravenous Given 07/03/15 1346)  ipratropium-albuterol (DUONEB)  0.5-2.5 (3) MG/3ML nebulizer solution 3 mL (3 mLs Nebulization Given 07/03/15 1426)    MDM   Final diagnoses:  None  melena Alcohol abuse  69 year old male with history of alcohol abuse and alcoholic gastritis with previous GI bleed who presents with 2 weeks of melena. Patient awake, chronically ill-appearing but in no acute distress at presentation. Vital signs unremarkable. No abdominal tenderness on exam. He did have melenic stool which was Hemoccult positive. Obtained labs listed above to evaluate for anemia. They've the patient IV protonic's as well as a DuoNeb because of his chronic wheezing from COPD.    Labs showed unremarkable CMP, CBC with hemoglobin 12.5 which is higher than his previous hospitalization for GI bleed. EtOH 213. Because of the patient's alcohol abuse and risk for ongoing GI bleed, I recommended admission for work up including serial H/H. Patient initially agreed but later after I left he left the hospital. He was  located by security because he still had an IV in place. He returned to triage for removal of IV. Despite multiple recommendations from nursing staff as well as the patient's niece to try to convince him to stay for hospitalization, the patient desired to leave and left AMA.  Laurence Spates, MD 07/03/15 2322

## 2015-07-04 LAB — POC OCCULT BLOOD, ED: FECAL OCCULT BLD: POSITIVE — AB

## 2015-08-18 ENCOUNTER — Encounter (HOSPITAL_COMMUNITY): Payer: Self-pay | Admitting: *Deleted

## 2015-08-18 ENCOUNTER — Emergency Department (HOSPITAL_COMMUNITY)
Admission: EM | Admit: 2015-08-18 | Discharge: 2015-08-18 | Disposition: A | Discharge status: Home / Self Care | Payer: Medicare Other | Attending: Emergency Medicine | Admitting: Emergency Medicine

## 2015-08-18 ENCOUNTER — Emergency Department (HOSPITAL_COMMUNITY): Payer: Medicare Other

## 2015-08-18 DIAGNOSIS — Z720 Tobacco use: Secondary | ICD-10-CM | POA: Insufficient documentation

## 2015-08-18 DIAGNOSIS — Z8659 Personal history of other mental and behavioral disorders: Secondary | ICD-10-CM | POA: Insufficient documentation

## 2015-08-18 DIAGNOSIS — I2510 Atherosclerotic heart disease of native coronary artery without angina pectoris: Secondary | ICD-10-CM | POA: Diagnosis not present

## 2015-08-18 DIAGNOSIS — Z87448 Personal history of other diseases of urinary system: Secondary | ICD-10-CM | POA: Insufficient documentation

## 2015-08-18 DIAGNOSIS — Z8719 Personal history of other diseases of the digestive system: Secondary | ICD-10-CM | POA: Insufficient documentation

## 2015-08-18 DIAGNOSIS — R042 Hemoptysis: Secondary | ICD-10-CM

## 2015-08-18 DIAGNOSIS — J441 Chronic obstructive pulmonary disease with (acute) exacerbation: Secondary | ICD-10-CM | POA: Insufficient documentation

## 2015-08-18 DIAGNOSIS — J40 Bronchitis, not specified as acute or chronic: Secondary | ICD-10-CM

## 2015-08-18 DIAGNOSIS — Z79899 Other long term (current) drug therapy: Secondary | ICD-10-CM | POA: Insufficient documentation

## 2015-08-18 DIAGNOSIS — Z951 Presence of aortocoronary bypass graft: Secondary | ICD-10-CM | POA: Insufficient documentation

## 2015-08-18 DIAGNOSIS — I252 Old myocardial infarction: Secondary | ICD-10-CM | POA: Insufficient documentation

## 2015-08-18 HISTORY — DX: Gastrointestinal hemorrhage, unspecified: K92.2

## 2015-08-18 LAB — D-DIMER, QUANTITATIVE: D-Dimer, Quant: 0.42 ug/mL-FEU (ref 0.00–0.48)

## 2015-08-18 LAB — BASIC METABOLIC PANEL
ANION GAP: 6 (ref 5–15)
BUN: 7 mg/dL (ref 6–20)
CHLORIDE: 102 mmol/L (ref 101–111)
CO2: 32 mmol/L (ref 22–32)
Calcium: 8.9 mg/dL (ref 8.9–10.3)
Creatinine, Ser: 0.71 mg/dL (ref 0.61–1.24)
Glucose, Bld: 101 mg/dL — ABNORMAL HIGH (ref 65–99)
POTASSIUM: 4.2 mmol/L (ref 3.5–5.1)
SODIUM: 140 mmol/L (ref 135–145)

## 2015-08-18 LAB — CBC WITH DIFFERENTIAL/PLATELET
BASOS ABS: 0 10*3/uL (ref 0.0–0.1)
Basophils Relative: 0 %
Eosinophils Absolute: 0.2 10*3/uL (ref 0.0–0.7)
Eosinophils Relative: 4 %
HCT: 37.5 % — ABNORMAL LOW (ref 39.0–52.0)
HEMOGLOBIN: 12.4 g/dL — AB (ref 13.0–17.0)
LYMPHS ABS: 1.2 10*3/uL (ref 0.7–4.0)
LYMPHS PCT: 24 %
MCH: 33.1 pg (ref 26.0–34.0)
MCHC: 33.1 g/dL (ref 30.0–36.0)
MCV: 100 fL (ref 78.0–100.0)
Monocytes Absolute: 0.7 10*3/uL (ref 0.1–1.0)
Monocytes Relative: 13 %
NEUTROS ABS: 3 10*3/uL (ref 1.7–7.7)
NEUTROS PCT: 59 %
Platelets: 327 10*3/uL (ref 150–400)
RBC: 3.75 MIL/uL — AB (ref 4.22–5.81)
RDW: 16.6 % — ABNORMAL HIGH (ref 11.5–15.5)
WBC: 5.1 10*3/uL (ref 4.0–10.5)

## 2015-08-18 MED ORDER — DOXYCYCLINE HYCLATE 100 MG PO CAPS: 100.0000 mg | ORAL_CAPSULE | Freq: Two times a day (BID) | ORAL | Status: AC

## 2015-08-18 MED ORDER — ALBUTEROL SULFATE HFA 108 (90 BASE) MCG/ACT IN AERS: 1.0000 | INHALATION_SPRAY | Freq: Four times a day (QID) | RESPIRATORY_TRACT | Status: AC | PRN

## 2015-08-18 MED ORDER — IPRATROPIUM-ALBUTEROL 0.5-2.5 (3) MG/3ML IN SOLN
3.0000 mL | Freq: Once | RESPIRATORY_TRACT | Status: AC
  Administered 2015-08-18: 3 mL via RESPIRATORY_TRACT
  Filled 2015-08-18: qty 3

## 2015-08-18 NOTE — Discharge Instructions (Signed)
Hemoptysis Hemoptysis means coughing up blood. The blood may come from the lungs and airways. It can also come from bleeding that occurs outside the lungs and airways. Coughing up blood can be a sign of a minor problem or a serious medical condition.  HOME CARE  Only take medicine as told by your doctor. Do not use medicines that help you stop coughing (cough suppressants) unless your doctor approves.  If you are given antibiotic medicine, take it as told. Finish it even if you start to feel better.  Do not smoke. Also avoid being around others when they are smoking.  Follow up with your doctor as told. GET HELP RIGHT AWAY IF:  You cough up bloody spit (mucus) for longer than a week.  You have a blood-producing cough that is severe or getting worse.  You have a blood-producing cough thatcomes and goes over time.  You have trouble breathing.   You throw up (vomit) blood.  You have bloody or black poop (stool).  You have chest pain.   You have night sweats.  You feel faint or pass out.   You have a fever or lasting symptoms for more than 2-3 days.  You have a fever and your symptoms suddenly get worse. MAKE SURE YOU:  Understand these instructions.  Will watch your condition.  Will get help right away if you are not doing well or get worse.   This information is not intended to replace advice given to you by your health care provider. Make sure you discuss any questions you have with your health care provider.   Document Released: 09/30/2012 Document Reviewed: 09/30/2012 Elsevier Interactive Patient Education [employer]2016 Elsevier Inc.

## 2015-08-18 NOTE — ED Notes (Signed)
Pt states he coughed up blood x 5 since yesterday which has been worse in the mornings.

## 2015-08-18 NOTE — ED Provider Notes (Signed)
CSN: [id number]     Arrival date & time 08/18/15  1101 History   First MD Initiated Contact with Patient 08/18/15 1151     Chief Complaint  Patient presents with  . Hemoptysis    Patient is a 69 y.o. male presenting with cough. The history is provided by the patient.  Cough Cough characteristics:  Productive Sputum characteristics:  Bloody Severity:  Moderate Onset quality:  Gradual Duration:  1 day Timing:  Intermittent Progression:  Worsening Chronicity:  New Relieved by:  Nothing Worsened by:  Nothing tried Associated symptoms: shortness of breath   Associated symptoms: no chest pain and no fever   pt reports he has been coughing up blood since yesterday He reports multiple episodes reported No vomiting blood No bloody stool reported No abd pain No CP No fever is reported  He reports similar episode occurred last year when he had GI bleed    Pt reports he has been in weekend jail but not prison recently Past Medical History  Diagnosis Date  . Myocardial infarction (HCC)   . Enlarged prostate   . Alcohol abuse   . COPD (chronic obstructive pulmonary disease) (HCC)   . Tobacco use   . Coronary artery disease   . Anxiety   . BPH (benign prostatic hypertrophy)   . GI bleed    Past Surgical History  Procedure Laterality Date  . Open heart surgery    . Coronary artery bypass graft    . Cardiac catheterization    . Hernia repair Right   . Colonoscopy with propofol N/A 01/31/2015    Dr. Darrick PennaFields: 1. left colon slightly redudant, moderate sized internal hemorrhoids  . Esophagogastroduodenoscopy (egd) with propofol N/A 01/31/2015    Dr. Darrick PennaFields: gastritis, duodenitis, Cameron's ulcers, large hiatal hernia with paraesophageal hernias with few linear ulcers, moderate gastritis, duodenitis. Negative celiac, negative H.pylori    Family History  Problem Relation Age of Onset  . Colon cancer Brother [id number]   Social History  Substance Use Topics  . Smoking status: Current Every  Day Smoker -- 2.00 packs/day for 55 years  . Smokeless tobacco: None  . Alcohol Use: Yes     Comment: 6 beers daily    Review of Systems  Constitutional: Negative for fever.  HENT: Negative for nosebleeds.   Respiratory: Positive for cough and shortness of breath.   Cardiovascular: Negative for chest pain.  Gastrointestinal: Negative for blood in stool.  All other systems reviewed and are negative.     Allergies  Aspirin  Home Medications   Prior to Admission medications   Medication Sig Start Date End Date Taking? Authorizing Provider  SPIRIVA HANDIHALER 18 MCG inhalation capsule Place 1 spray into inhaler and inhale daily. 01/17/15  Yes Historical Provider, MD  folic acid (FOLVITE) 1 MG tablet Take 1 tablet (1 mg total) by mouth daily. Patient not taking: Reported on 08/18/2015 02/01/15   Henderson CloudEstela Y Hernandez Acosta, MD  Multiple Vitamin (MULTIVITAMIN WITH MINERALS) TABS tablet Take 1 tablet by mouth daily. Patient not taking: Reported on 08/18/2015 02/01/15   Henderson CloudEstela Y Hernandez Acosta, MD  nitroGLYCERIN (NITROSTAT) 0.4 MG SL tablet Place 1 tablet (0.4 mg total) under the tongue every 5 (five) minutes as needed for chest pain. Patient taking differently: Place 0.4 mg under the tongue every 5 (five) minutes as needed for chest pain (never used).  02/01/15   Henderson CloudEstela Y Hernandez Acosta, MD  pantoprazole (PROTONIX) 40 MG tablet Take 1 tablet (40 mg total) by mouth  2 (two) times daily before a meal. Patient not taking: Reported on 08/18/2015 02/01/15   Henderson Cloud, MD  thiamine 100 MG tablet Take 1 tablet (100 mg total) by mouth daily. Patient not taking: Reported on 08/18/2015 02/01/15   Henderson Cloud, MD   BP 121/81 mmHg  Pulse 93  Temp(Src) 99.5 F (37.5 C) (Rectal)  Resp 30  Ht 6' (1.829 m)  Wt 130 lb (58.968 kg)  BMI 17.63 kg/m2  SpO2 100% Physical Exam CONSTITUTIONAL: thin appearing, no distress noted HEAD: Normocephalic/atraumatic EYES: EOMI ENMT:  Mucous membranes moist, no blood noted in nares, no blood in oropharynx NECK: supple no meningeal signs SPINE/BACK:entire spine nontender CV: S1/S2 noted LUNGS: scattered wheezing bilaterally, no distress noted ABDOMEN: soft, nontender, no rebound or guarding, bowel sounds noted throughout abdomen GU:no cva tenderness Rectal - stool color brown, no melena/blood, hemoccult negative NEURO: Pt is awake/alert/appropriate, moves all extremitiesx4.  No facial droop.   EXTREMITIES: pulses normal/equal, full ROM SKIN: warm, color normal PSYCH: no abnormalities of mood noted, alert and oriented to situation  ED Course  Procedures  12:49 PM Pt here with hemoptysis He is a poor historian Reports similar to when he had GI bleed previously (had upper GI bleed in 01/2015) Therefore rectal performed and negative for GI bleed However due to cough/hemoptysis, labs ordered No acute change by CXR 3:03 PM Pt stable Low risk for PE and d-dimer negative He feels improved after nebs Suspect bronchitis but due to hemoptysis will start antibiotics He has not any significant hemoptysis in the ER I feel he is safe for d/c home I doubt TB at this time  Nurse note at 1246 was error, meant for another patient  Labs Review Labs Reviewed  BASIC METABOLIC PANEL - Abnormal; Notable for the following:    Glucose, Bld 101 (*)    All other components within normal limits  CBC WITH DIFFERENTIAL/PLATELET - Abnormal; Notable for the following:    RBC 3.75 (*)    Hemoglobin 12.4 (*)    HCT 37.5 (*)    RDW 16.6 (*)    All other components within normal limits  D-DIMER, QUANTITATIVE (NOT AT [facility])  POC OCCULT BLOOD, ED    Imaging Review Dg Chest 2 View  08/18/2015  CLINICAL DATA:  Cough.  Hemoptysis. EXAM: CHEST  2 VIEW COMPARISON:  01/28/2015 chest radiograph FINDINGS: Median sternotomy wires and CABG clips are stable in configuration. Stable cardiomediastinal silhouette with normal heart size. No  pneumothorax. No pleural effusion. Hyperinflated lungs. No pulmonary edema . No new focal lung opacity. There is stable retrocardiac lucency suggestive of a small to moderate hiatal hernia. There is stable scarring at the left lung base. IMPRESSION: 1. Hyperinflated lungs, suggesting COPD. 2. Stable scarring at the left lung base, with no acute focal lung opacity. 3. Stable retrocardiac lucency suggestive of a small to moderate hiatal hernia. Electronically Signed   By: Delbert Phenix M.D.   On: 08/18/2015 11:43   I have personally reviewed and evaluated these images and lab results as part of my medical decision-making.    MDM   Final diagnoses:  Hemoptysis  Bronchitis    Nursing notes including past medical history and social history reviewed and considered in documentation xrays/imaging reviewed by myself and considered during evaluation Labs/vital reviewed myself and considered during evaluation Previous records reviewed and considered     Zadie Rhine, MD 08/18/15 1506

## 2015-08-21 LAB — POC OCCULT BLOOD, ED: FECAL OCCULT BLD: NEGATIVE

## 2015-09-03 ENCOUNTER — Encounter (HOSPITAL_COMMUNITY): Payer: Self-pay | Admitting: *Deleted

## 2015-09-03 ENCOUNTER — Emergency Department (HOSPITAL_COMMUNITY)
Admission: EM | Admit: 2015-09-03 | Discharge: 2015-09-03 | Disposition: A | Discharge status: Home / Self Care | Payer: Medicare Other | Attending: Emergency Medicine | Admitting: Emergency Medicine

## 2015-09-03 ENCOUNTER — Emergency Department (HOSPITAL_COMMUNITY): Payer: Medicare Other

## 2015-09-03 DIAGNOSIS — Z720 Tobacco use: Secondary | ICD-10-CM | POA: Diagnosis not present

## 2015-09-03 DIAGNOSIS — I2510 Atherosclerotic heart disease of native coronary artery without angina pectoris: Secondary | ICD-10-CM | POA: Diagnosis not present

## 2015-09-03 DIAGNOSIS — Z8719 Personal history of other diseases of the digestive system: Secondary | ICD-10-CM | POA: Insufficient documentation

## 2015-09-03 DIAGNOSIS — R079 Chest pain, unspecified: Secondary | ICD-10-CM | POA: Diagnosis present

## 2015-09-03 DIAGNOSIS — R0789 Other chest pain: Secondary | ICD-10-CM | POA: Diagnosis not present

## 2015-09-03 DIAGNOSIS — Z79899 Other long term (current) drug therapy: Secondary | ICD-10-CM | POA: Diagnosis not present

## 2015-09-03 DIAGNOSIS — I252 Old myocardial infarction: Secondary | ICD-10-CM | POA: Diagnosis not present

## 2015-09-03 DIAGNOSIS — Z87448 Personal history of other diseases of urinary system: Secondary | ICD-10-CM | POA: Diagnosis not present

## 2015-09-03 DIAGNOSIS — Z8659 Personal history of other mental and behavioral disorders: Secondary | ICD-10-CM | POA: Diagnosis not present

## 2015-09-03 DIAGNOSIS — J441 Chronic obstructive pulmonary disease with (acute) exacerbation: Secondary | ICD-10-CM | POA: Diagnosis not present

## 2015-09-03 MED ORDER — IPRATROPIUM BROMIDE 0.02 % IN SOLN
0.5000 mg | Freq: Once | RESPIRATORY_TRACT | Status: AC
  Administered 2015-09-03: 0.5 mg via RESPIRATORY_TRACT
  Filled 2015-09-03: qty 2.5

## 2015-09-03 MED ORDER — TRAMADOL HCL 50 MG PO TABS: 50.0000 mg | ORAL_TABLET | Freq: Four times a day (QID) | ORAL | Status: AC | PRN

## 2015-09-03 MED ORDER — ALBUTEROL SULFATE (2.5 MG/3ML) 0.083% IN NEBU
5.0000 mg | INHALATION_SOLUTION | Freq: Once | RESPIRATORY_TRACT | Status: AC
  Administered 2015-09-03: 5 mg via RESPIRATORY_TRACT
  Filled 2015-09-03: qty 6

## 2015-09-03 MED ORDER — TRAMADOL HCL 50 MG PO TABS
50.0000 mg | ORAL_TABLET | Freq: Once | ORAL | Status: AC
  Administered 2015-09-03: 50 mg via ORAL
  Filled 2015-09-03: qty 1

## 2015-09-03 NOTE — Discharge Instructions (Signed)
Your x-rays do not show a rib fracture tonight. You can continue wearing the rib belt for comfort. However try to take a big deep breath at least every half hour so you do not develop pneumonia. Take the pain medication. Follow-up with your doctor if not improving in the next couple days.   Chest Wall Pain Chest wall pain is pain in or around the bones and muscles of your chest. Sometimes, an injury causes this pain. Sometimes, the cause may not be known. This pain may take several weeks or longer to get better. HOME CARE Pay attention to any changes in your symptoms. Take these actions to help with your pain:  Rest as told by your doctor.  Avoid activities that cause pain. Try not to use your chest, belly (abdominal), or side muscles to lift heavy things.  If directed, apply ice to the painful area:  Put ice in a plastic bag.  Place a towel between your skin and the bag.  Leave the ice on for 20 minutes, 2-3 times per day.  Take over-the-counter and prescription medicines only as told by your doctor.  Do not use tobacco products, including cigarettes, chewing tobacco, and e-cigarettes. If you need help quitting, ask your doctor.  Keep all follow-up visits as told by your doctor. This is important. GET HELP IF:  You have a fever.  Your chest pain gets worse.  You have new symptoms. GET HELP RIGHT AWAY IF:  You feel sick to your stomach (nauseous) or you throw up (vomit).  You feel sweaty or light-headed.  You have a cough with phlegm (sputum) or you cough up blood.  You are short of breath.   This information is not intended to replace advice given to you by your health care provider. Make sure you discuss any questions you have with your health care provider.   Document Released: 04/01/2008 Document Revised: 07/05/2015 Document Reviewed: 01/09/2015 Elsevier Interactive Patient Education [employer]2016 Elsevier Inc.

## 2015-09-03 NOTE — ED Provider Notes (Signed)
CSN: [id number]     Arrival date & time 09/03/15  0203 History   First MD Initiated Contact with Patient 09/03/15 0150     Chief Complaint  Patient presents with  . Chest Pain     (Consider location/radiation/quality/duration/timing/severity/associated sxs/prior Treatment) HPI patient reports a history of COPD who continues to smoke 1 pack a day. When I ask him how long he's been short of breath he states "a long time". When I ask him how long he's been coughing he states "all my life". He reports he has been having increasing coughing and shortness of breath that got worse 2 days ago. He believes he may of cracked a rib from coughing and has pain in his right lateral chest wall. He states it hurts when he breathes deep or coughs and it's tender to touch, he has been wearing a back brace which helps with the rib pain. He states he is chronically weak. He has an albuterol and Spiriva inhalers that he uses. Patient states he has oxygen he uses at home when necessary. He states he normally uses 3 L/m nasal cannula.   [facility] in [place]   Past Medical History  Diagnosis Date  . Myocardial infarction (HCC)   . Enlarged prostate   . Alcohol abuse   . COPD (chronic obstructive pulmonary disease) (HCC)   . Tobacco use   . Coronary artery disease   . Anxiety   . BPH (benign prostatic hypertrophy)   . GI bleed    Past Surgical History  Procedure Laterality Date  . Open heart surgery    . Coronary artery bypass graft    . Cardiac catheterization    . Hernia repair Right   . Colonoscopy with propofol N/A 01/31/2015    Dr. Darrick Penna: 1. left colon slightly redudant, moderate sized internal hemorrhoids  . Esophagogastroduodenoscopy (egd) with propofol N/A 01/31/2015    Dr. Darrick Penna: gastritis, duodenitis, Cameron's ulcers, large hiatal hernia with paraesophageal hernias with few linear ulcers, moderate gastritis, duodenitis. Negative celiac, negative H.pylori    Family History  Problem  Relation Age of Onset  . Colon cancer Brother 53   Social History  Substance Use Topics  . Smoking status: Current Every Day Smoker -- 2.00 packs/day for 55 years  . Smokeless tobacco: None  . Alcohol Use: Yes     Comment: 6 beers daily  lives at home Lives with spouse  Review of Systems  All other systems reviewed and are negative.     Allergies  Aspirin  Home Medications   Prior to Admission medications   Medication Sig Start Date End Date Taking? Authorizing Provider  albuterol (PROVENTIL HFA;VENTOLIN HFA) 108 (90 BASE) MCG/ACT inhaler Inhale 1-2 puffs into the lungs every 6 (six) hours as needed for wheezing or shortness of breath. 08/18/15   Zadie Rhine, MD  doxycycline (VIBRAMYCIN) 100 MG capsule Take 1 capsule (100 mg total) by mouth 2 (two) times daily. One po bid x 7 days 08/18/15   Zadie Rhine, MD  nitroGLYCERIN (NITROSTAT) 0.4 MG SL tablet Place 1 tablet (0.4 mg total) under the tongue every 5 (five) minutes as needed for chest pain. Patient taking differently: Place 0.4 mg under the tongue every 5 (five) minutes as needed for chest pain (never used).  02/01/15   Henderson Cloud, MD  pantoprazole (PROTONIX) 40 MG tablet Take 1 tablet (40 mg total) by mouth 2 (two) times daily before a meal. Patient not taking: Reported on 08/18/2015 02/01/15  Henderson CloudEstela Y Hernandez Acosta, MD  SPIRIVA HANDIHALER 18 MCG inhalation capsule Place 1 spray into inhaler and inhale daily. 01/17/15   Historical Provider, MD  thiamine 100 MG tablet Take 1 tablet (100 mg total) by mouth daily. Patient not taking: Reported on 08/18/2015 02/01/15   Henderson CloudEstela Y Hernandez Acosta, MD  traMADol (ULTRAM) 50 MG tablet Take 1 tablet (50 mg total) by mouth every 6 (six) hours as needed. 09/03/15   Devoria AlbeIva Lashaundra Lehrmann, MD   BP 125/71 mmHg  Pulse 91  Temp(Src) 97.5 F (36.4 C) (Oral)  Resp 20  SpO2 89%  Vital signs normal except for hypoxia on room air which improved when he was placed on 2 L/m nasal  cannula oxygen  Physical Exam  Constitutional: He is oriented to person, place, and time.  Non-toxic appearance. He does not appear ill. No distress.  Thin male who appears older than his stated age  HENT:  Head: Normocephalic and atraumatic.  Right Ear: External ear normal.  Left Ear: External ear normal.  Nose: Nose normal. No mucosal edema or rhinorrhea.  Mouth/Throat: Oropharynx is clear and moist and mucous membranes are normal. No dental abscesses or uvula swelling.  Eyes: Conjunctivae and EOM are normal. Pupils are equal, round, and reactive to light.  Neck: Normal range of motion and full passive range of motion without pain. Neck supple.  Cardiovascular: Normal rate, regular rhythm and normal heart sounds.  Exam reveals no gallop and no friction rub.   No murmur heard. Pulmonary/Chest: Effort normal. No respiratory distress. He has wheezes. He has no rhonchi. He has no rales. He exhibits tenderness. He exhibits no crepitus.    Abdominal: Soft. Normal appearance and bowel sounds are normal. He exhibits no distension. There is no tenderness. There is no rebound and no guarding.  Musculoskeletal: Normal range of motion. He exhibits no edema or tenderness.  Moves all extremities well.   Neurological: He is alert and oriented to person, place, and time. He has normal strength. No cranial nerve deficit.  Skin: Skin is warm, dry and intact. No rash noted. No erythema. No pallor.  Psychiatric: He has a normal mood and affect. His speech is normal and behavior is normal. His mood appears not anxious.  Nursing note and vitals reviewed.   ED Course  Procedures (including critical care time)  Medications  traMADol (ULTRAM) tablet 50 mg (not administered)  albuterol (PROVENTIL) (2.5 MG/3ML) 0.083% nebulizer solution 5 mg (5 mg Nebulization Given 09/03/15 0234)  ipratropium (ATROVENT) nebulizer solution 0.5 mg (0.5 mg Nebulization Given 09/03/15 0234)   Patient was given a nebulizer  treatment. He was given tramadol for pain after reviewing his x-ray. When patient was seen at time of discharge he states he's feeling much better. We discussed his x-rays did not show a obvious rib fracture but he could have a crack in the rib that may not be apparent on initial x-ray. He was given discharge instructions.    Labs Review Labs Reviewed - No data to display  Imaging Review Dg Ribs Unilateral W/chest Right  09/03/2015  CLINICAL DATA:  RIGHT lateral chest pain and cough for 3 days, concern for rib fracture. Smoker, history of COPD, myocardial infarction. EXAM: RIGHT RIBS AND CHEST - 3+ VIEW COMPARISON:  Chest radiograph August 18, 2015 FINDINGS: Cardiac silhouette is normal in size. Status post median sternotomy for CABG. Increased lung volumes, compatible with COPD, no pleural effusion or focal consolidation. No pneumothorax. Moderate hiatal hernia. Symmetric calcifications in the neck  are likely vascular. No acute rib fracture deformity. Old RIGHT approximate posterior seventh rib fracture. IMPRESSION: COPD, no acute cardiopulmonary process. No acute rib fracture deformity. Electronically Signed   By: Awilda Metro M.D.   On: 09/03/2015 03:18   I have personally reviewed and evaluated these images and lab results as part of my medical decision-making.   EKG Interpretation None      MDM   Final diagnoses:  Right-sided chest wall pain     New Prescriptions   TRAMADOL (ULTRAM) 50 MG TABLET    Take 1 tablet (50 mg total) by mouth every 6 (six) hours as needed.    Plan discharge  Devoria Albe, MD, Concha Pyo, MD 09/03/15 0400

## 2015-09-03 NOTE — ED Notes (Signed)
Pt reporting pain on right side.  States that he has been coughing frequently. Pt reports drinking 6 beers today.  Reporting daily alcohol use.

## 2016-01-27 DEATH — deceased

## 2017-03-31 IMAGING — DX DG RIBS W/ CHEST 3+V*R*
4 series · 4 of 4 positions shown · non-contrast
Comparison: Chest radiograph August 18, 2015

CLINICAL DATA: RIGHT lateral chest pain and cough for 3 days,
concern for rib fracture. Smoker, history of COPD, myocardial
infarction.

EXAM:
RIGHT RIBS AND CHEST - 3+ VIEW

[chest pa]
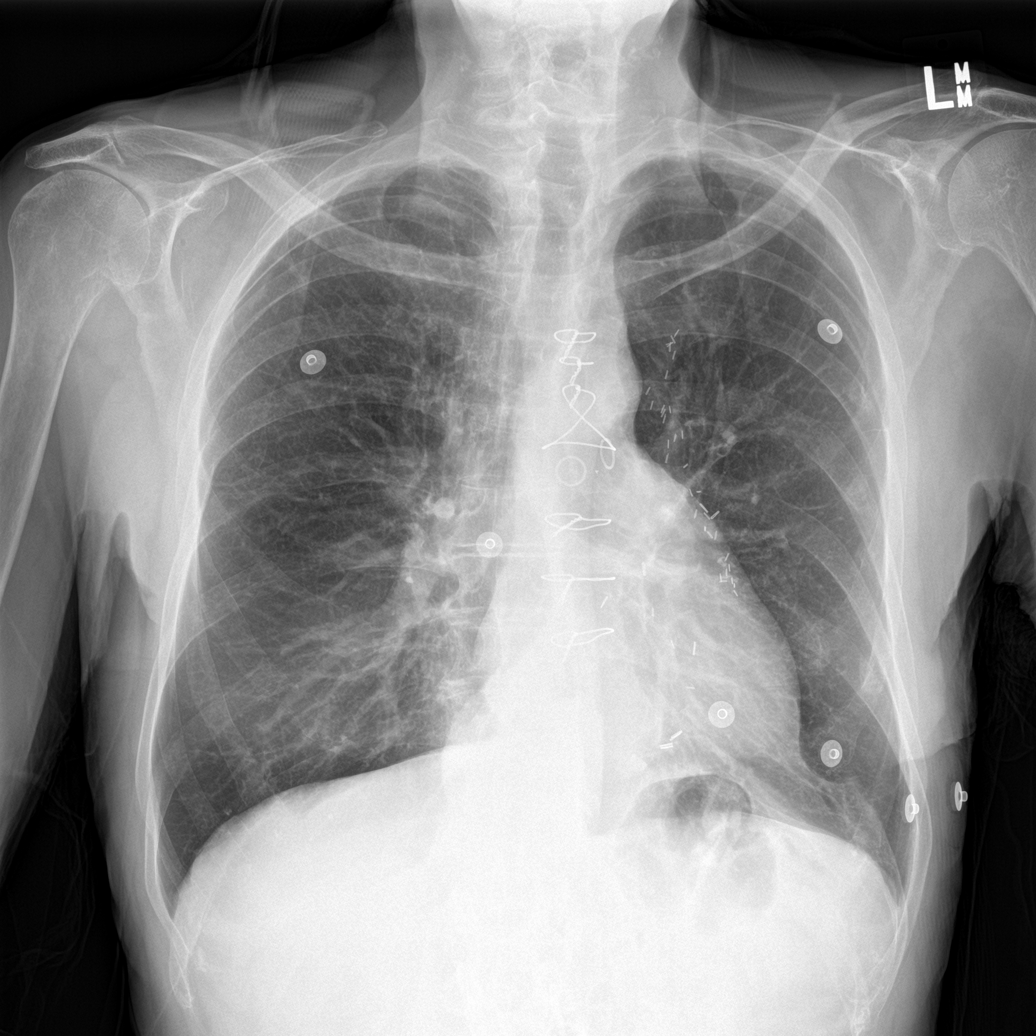

[rib pa]
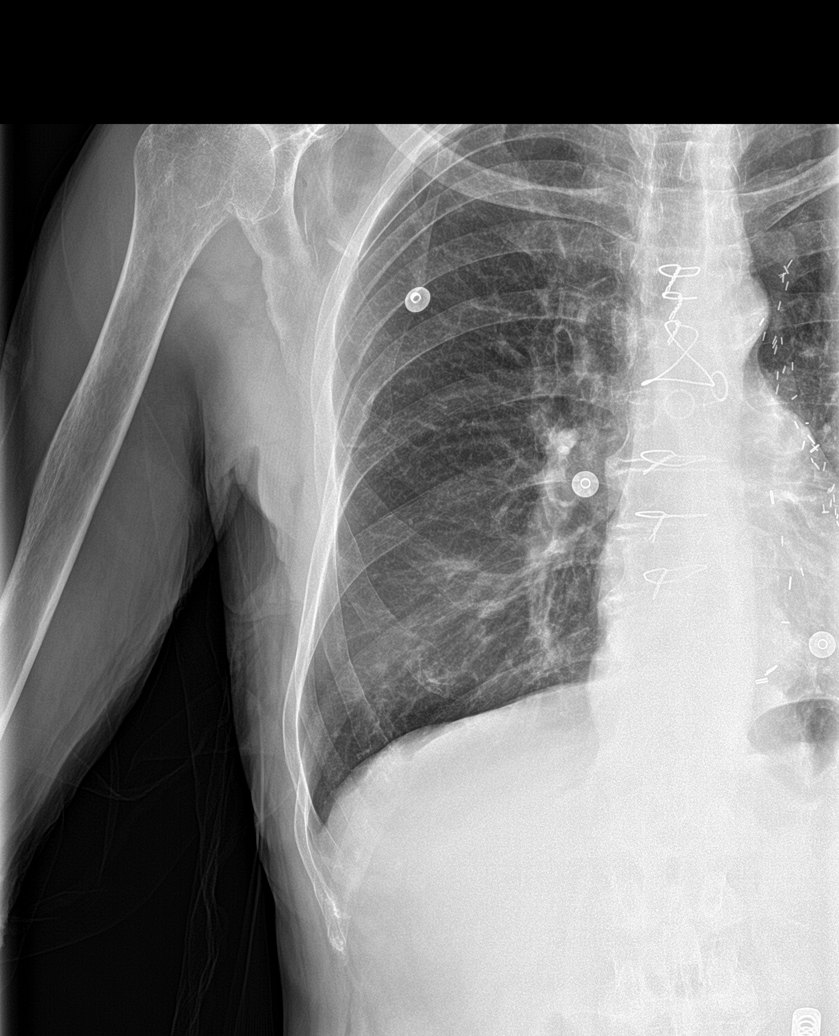

[rib pa obl (1 of 2)]
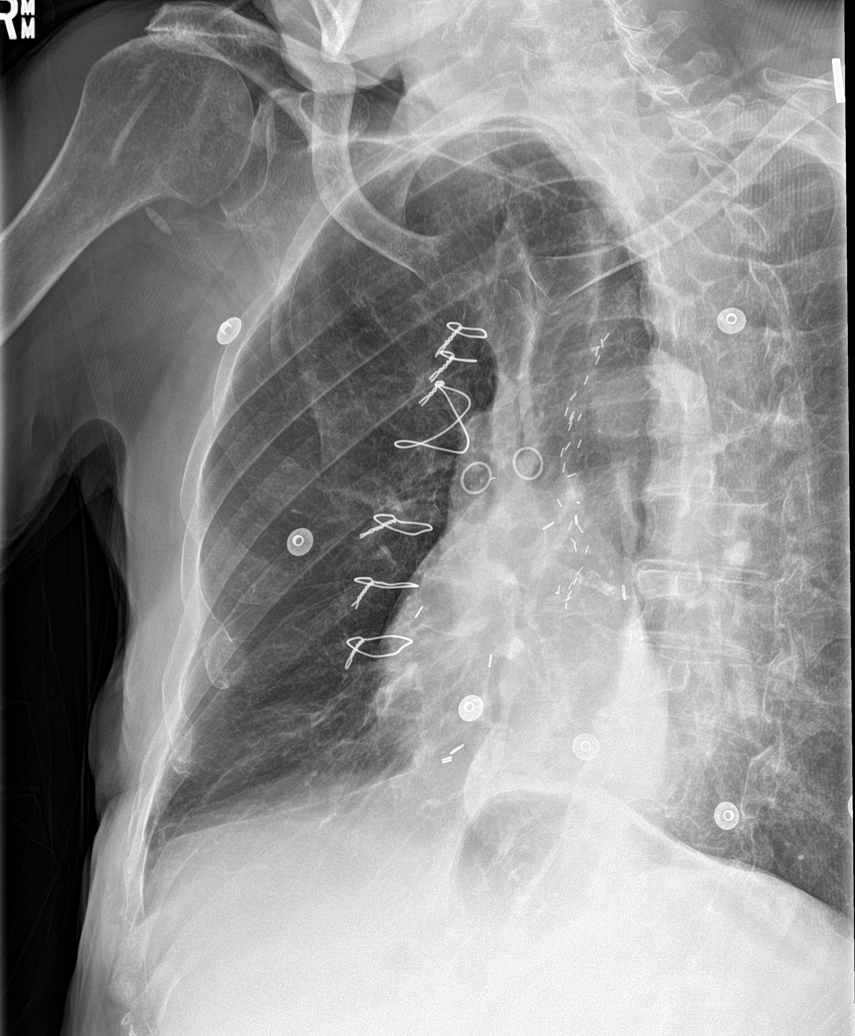

[rib pa obl (2 of 2)]
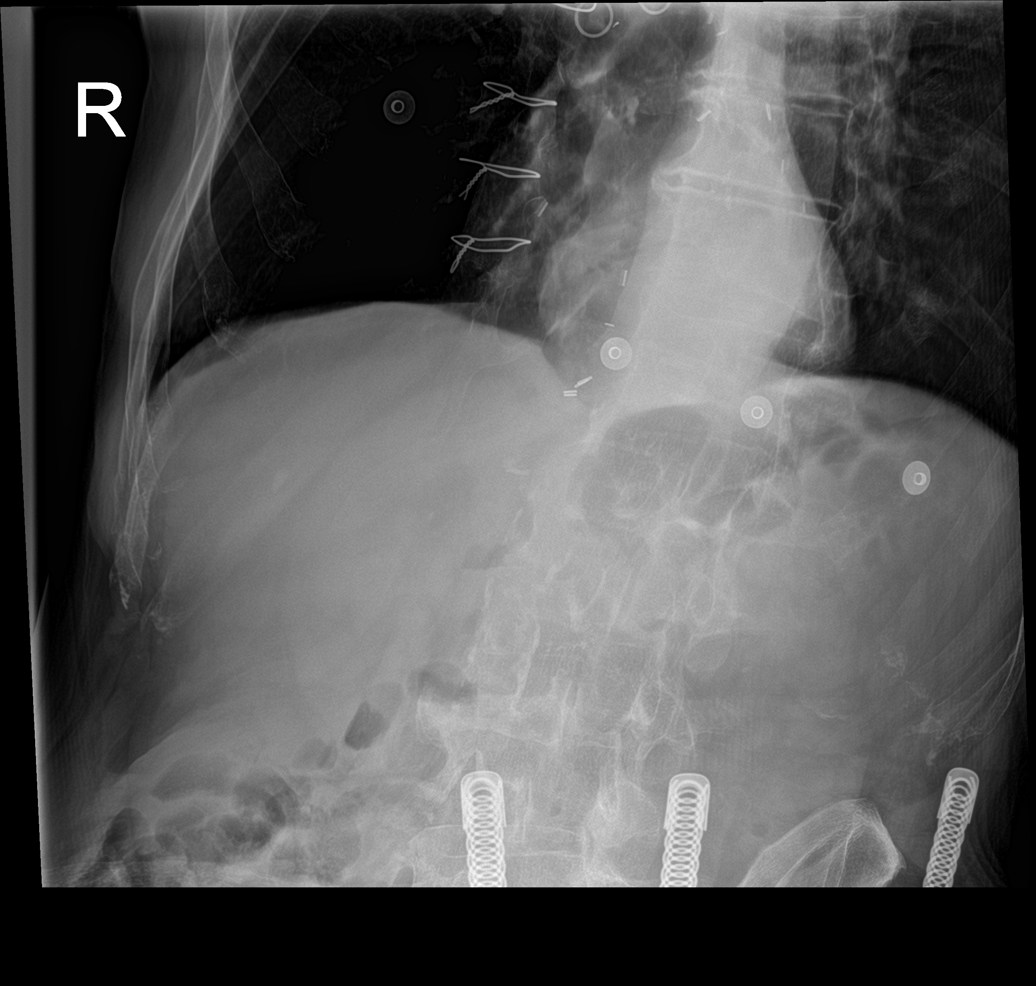

[4 of 4 positions shown; findings below may reference images not displayed]

FINDINGS: Cardiac silhouette is normal in size. Status post median sternotomy
for CABG. Increased lung volumes, compatible with COPD, no pleural
effusion or focal consolidation. No pneumothorax. Moderate hiatal
hernia. Symmetric calcifications in the neck are likely vascular. No
acute rib fracture deformity. Old RIGHT approximate posterior
seventh rib fracture.
IMPRESSION: COPD, no acute cardiopulmonary process.

No acute rib fracture deformity.

## 2019-02-22 NOTE — Progress Notes (Signed)
REVIEWED-NO ADDITIONAL RECOMMENDATIONS.
